# Patient Record
Sex: Female | Born: 1955 | Race: White | Hispanic: No | Smoking: Never smoker
Health system: Southern US, Community
[De-identification: ages and names within clinical notes are randomized; demographics above are authoritative.]

## PROBLEM LIST (undated history)

## (undated) DIAGNOSIS — R002 Palpitations: Secondary | ICD-10-CM

## (undated) DIAGNOSIS — I10 Essential (primary) hypertension: Secondary | ICD-10-CM

## (undated) DIAGNOSIS — R079 Chest pain, unspecified: Secondary | ICD-10-CM

## (undated) DIAGNOSIS — I471 Supraventricular tachycardia, unspecified: Secondary | ICD-10-CM

## (undated) HISTORY — DX: Supraventricular tachycardia, unspecified: I47.10

## (undated) HISTORY — PX: TONSILLECTOMY: SHX5217

## (undated) HISTORY — DX: Chest pain, unspecified: R07.9

## (undated) HISTORY — PX: DILATATION & CURRETTAGE/HYSTEROSCOPY WITH RESECTOCOPE: SHX5572

## (undated) HISTORY — PX: TUBAL LIGATION: SHX77

## (undated) HISTORY — DX: Supraventricular tachycardia: I47.1

## (undated) HISTORY — DX: Palpitations: R00.2

## (undated) HISTORY — PX: APPENDECTOMY: SHX54

## (undated) HISTORY — DX: Essential (primary) hypertension: I10

---

## 1998-07-31 ENCOUNTER — Other Ambulatory Visit: Admission: RE | Admit: 1998-07-31 | Discharge: 1998-07-31 | Payer: Self-pay | Admitting: Obstetrics and Gynecology

## 2001-02-02 ENCOUNTER — Other Ambulatory Visit: Admission: RE | Admit: 2001-02-02 | Discharge: 2001-02-02 | Payer: Self-pay | Admitting: Obstetrics and Gynecology

## 2002-06-28 ENCOUNTER — Other Ambulatory Visit: Admission: RE | Admit: 2002-06-28 | Discharge: 2002-06-28 | Payer: Self-pay | Admitting: Obstetrics and Gynecology

## 2002-07-12 ENCOUNTER — Encounter: Payer: Self-pay | Admitting: Family Medicine

## 2002-07-12 ENCOUNTER — Ambulatory Visit (HOSPITAL_COMMUNITY): Admission: RE | Admit: 2002-07-12 | Discharge: 2002-07-12 | Payer: Self-pay | Admitting: Family Medicine

## 2002-07-19 ENCOUNTER — Ambulatory Visit (HOSPITAL_COMMUNITY): Admission: RE | Admit: 2002-07-19 | Discharge: 2002-07-19 | Payer: Self-pay | Admitting: Obstetrics and Gynecology

## 2002-07-19 ENCOUNTER — Encounter (INDEPENDENT_AMBULATORY_CARE_PROVIDER_SITE_OTHER): Payer: Self-pay

## 2003-06-19 ENCOUNTER — Inpatient Hospital Stay (HOSPITAL_COMMUNITY): Admission: EM | Admit: 2003-06-19 | Discharge: 2003-06-20 | Payer: Self-pay | Admitting: Emergency Medicine

## 2003-06-19 ENCOUNTER — Encounter (INDEPENDENT_AMBULATORY_CARE_PROVIDER_SITE_OTHER): Payer: Self-pay | Admitting: *Deleted

## 2003-08-01 ENCOUNTER — Other Ambulatory Visit: Admission: RE | Admit: 2003-08-01 | Discharge: 2003-08-01 | Payer: Self-pay | Admitting: Obstetrics and Gynecology

## 2005-01-12 ENCOUNTER — Emergency Department (HOSPITAL_COMMUNITY): Admission: EM | Admit: 2005-01-12 | Discharge: 2005-01-12 | Payer: Self-pay | Admitting: Family Medicine

## 2005-10-25 ENCOUNTER — Encounter: Admission: RE | Admit: 2005-10-25 | Discharge: 2005-10-25 | Payer: Self-pay | Admitting: Orthopedic Surgery

## 2007-02-15 ENCOUNTER — Emergency Department (HOSPITAL_COMMUNITY): Admission: EM | Admit: 2007-02-15 | Discharge: 2007-02-16 | Payer: Self-pay | Admitting: Emergency Medicine

## 2007-07-28 LAB — BASIC METABOLIC PANEL
BUN: 17 mg/dL (ref 4–21)
Creatinine: 0.8 mg/dL (ref <=1.1)
GLUCOSE: 83 mg/dL
Potassium: 4.1 mmol/L (ref 3.4–5.3)
SODIUM: 141 mmol/L (ref 137–147)

## 2007-07-28 LAB — HEPATIC FUNCTION PANEL
ALT: 19 U/L (ref 7–35)
AST: 18 U/L (ref 13–35)
Alkaline Phosphatase: 94 U/L (ref 25–125)
Bilirubin, Total: 0.4 mg/dL

## 2007-07-28 LAB — CBC AND DIFFERENTIAL
HEMATOCRIT: 38 % (ref 36–46)
Hemoglobin: 11.9 g/dL — AB (ref 12.0–16.0)
PLATELETS: 300 10*3/uL (ref 150–399)
WBC: 5.9 10*3/mL

## 2007-09-05 LAB — HM COLONOSCOPY

## 2007-12-26 ENCOUNTER — Encounter: Admission: RE | Admit: 2007-12-26 | Discharge: 2007-12-26 | Payer: Self-pay | Admitting: Family Medicine

## 2008-07-23 ENCOUNTER — Encounter (INDEPENDENT_AMBULATORY_CARE_PROVIDER_SITE_OTHER): Payer: Self-pay | Admitting: Obstetrics and Gynecology

## 2008-07-23 ENCOUNTER — Ambulatory Visit (HOSPITAL_COMMUNITY): Admission: RE | Admit: 2008-07-23 | Discharge: 2008-07-23 | Payer: Self-pay | Admitting: Obstetrics and Gynecology

## 2008-10-16 ENCOUNTER — Encounter: Admission: RE | Admit: 2008-10-16 | Discharge: 2008-10-16 | Payer: Self-pay | Admitting: Otolaryngology

## 2010-06-17 LAB — CBC
HCT: 34.5 % — ABNORMAL LOW (ref 36.0–46.0)
Hemoglobin: 12.1 g/dL (ref 12.0–15.0)
MCHC: 35.1 g/dL (ref 30.0–36.0)
MCV: 90 fL (ref 78.0–100.0)
Platelets: 263 10*3/uL (ref 150–400)
RBC: 3.84 MIL/uL — ABNORMAL LOW (ref 3.87–5.11)
RDW: 13.5 % (ref 11.5–15.5)
WBC: 5.7 10*3/uL (ref 4.0–10.5)

## 2010-07-22 NOTE — Op Note (Signed)
NAME:  Alisha Valencia, Alisha Valencia                 ACCOUNT NO.:  [id number]   MEDICAL RECORD NO.:  [id number]          PATIENT TYPE:  AMB   LOCATION:  SDC                           FACILITY:  [facility]   PHYSICIAN:  Maxie Better, M.D.DATE OF BIRTH:  September 05, 1955   DATE OF PROCEDURE:  07/23/2008  DATE OF DISCHARGE:  07/23/2008                               OPERATIVE REPORT   PREOPERATIVE DIAGNOSES:  Postmenopausal bleeding, endometrial mass.   PROCEDURE:  Diagnostic hysteroscopy, hysteroscopic resection of the  endocervical polyp, D and C.   POSTOPERATIVE DIAGNOSES:  Postmenopausal bleeding, endometrial  mass/endometrial polyp.   ANESTHESIA:  General.   SURGEON:  Maxie Better, MD   ASSISTANT:  None.   PROCEDURE:  Under adequate general anesthesia, the patient was placed in  the dorsal lithotomy position.  She was sterilely prepped and draped in  usual fashion.  The bladder was catheterized for scant amount of urine.  The examination under anesthesia revealed an anteverted uterus.  No  adnexal masses could be appreciated.  Bivalve speculum was placed in the  vagina.  Single-tooth tenaculum was placed on the anterior tip of the  cervix.  The cervix was then serially dilated up to #27 [facility] dilator.  A sorbitol-primed resectoscope was introduced into the cervix; however,  difficulty in visualization despite several attempts resulted in the  resectoscope being changed to the diagnostic hysteroscope with  visualization much improved.  The uterine cavity was small.  The tubal  ostia were not seen.  The endocervical canal was elongated, and there  was an endocervical polyp noted anteriorly.  The diagnostic hysteroscope  was removed and attempt at grasping the polyp with the forceps clamp was  unsuccessful, and at that point the resectoscope was then reinserted  carefully entering the endocervical canal.  The polyp was identified and  resected, and the resectoscope removed.  The cavity was  curetted for  scant amount of tissue.   SPECIMEN:  Endocervical polyp and endometrial curettings were sent to  Pathology.   ESTIMATED BLOOD LOSS:  Minimal.   FLUID DEFICIT:  About 100.   COMPLICATIONS:  None.   The patient tolerated the procedure well, was transferred to recovery  room in stable condition.      Maxie Better, M.D.  Electronically Signed     [place]/MEDQ  D:  07/23/2008  T:  07/24/2008  Job:  [id number]

## 2010-07-25 NOTE — Op Note (Signed)
NAME:  Alisha Valencia, Alisha Valencia                           ACCOUNT NO.:  [id number]   MEDICAL RECORD NO.:  [id number]                   PATIENT TYPE:  AMB   LOCATION:  SDC                                  [facility]ACILITY:  [facility]   PHYSICIAN:  Sherry A. Rosalio Macadamia, M.D.           DATE OF BIRTH:  1955-06-09   DATE OF PROCEDURE:  07/19/2002  DATE OF DISCHARGE:                                 OPERATIVE REPORT   PREOPERATIVE DIAGNOSIS:  Menorrhagia.  Endometrial polyp.   POSTOPERATIVE DIAGNOSIS:  Menorrhagia.  Endometrial polyp.   PROCEDURE:  D & C hysteroscopy with resectoscope.   SURGEON:  Sherry A. Rosalio Macadamia, M.D.   ANESTHESIA:  MAC.   INDICATIONS:  This is a 55 year old G2, P2, 0-0-2, woman who has had  increasing menorrhagia with her monthly menses over the past few months.  Because of this, the patient had an ultrasound, which revealed a slightly  enlarged uterus with a thickened endometrium with a mass present in the  endometrial cavity.  Because of that, the patient was brought to the  operating room for D & C hysteroscopy with resectoscope.   FINDINGS:  Nine-week size, anteflexed uterus.  No adnexal mass.  Endometrial  polyp at right cornua.   PROCEDURE:  The patient was brought into the operating room and given  adequate IV sedation.  She was placed in the dorsolithotomy position.  The  perineum was washed with Hibiclens.  Pelvic examination was performed.  The  bladder was in-out catheterized.  The patient was draped in a sterile  fashion.  A speculum was placed within the vagina.  The vagina was washed  with Hibiclens.  Paracervical block was administered with 1% Nesacaine.  The  anterior lip of the cervix was grasped with a single-tooth tenaculum.  The  cervix was sounded.  The cervix was dilated with Pratt dilators to a #31.  The hysteroscope was introduced into the endometrial cavity.  Pictures were  obtained.  The right cornual polyp was removed using a single-loop right  angle  resector.  Some more pieces of the polyp were removed.  Then, sheaths  of endometrial tissue were resected circumferentially for sampling.  Small  bleeders were cauterized.  Pictures were obtained.  All instruments were  removed from the vagina.  Adequate hemostasis was present.  The patient was  taken out of the dorsolithotomy position.  She was awakened.  She was moved  from the operating table to a stretcher in a stable condition.   COMPLICATIONS:  None.   ESTIMATED BLOOD LOSS:  Less than 5 mL.   Sorbitol differential was -90 mL.                                               Sherry A. Rosalio Macadamia, M.D.  SAD/MEDQ  D:  07/19/2002  T:  07/20/2002  Job:  [id number]

## 2010-07-25 NOTE — Op Note (Signed)
NAME:  Alisha Valencia, Alisha Valencia                           ACCOUNT NO.:  [id number]   MEDICAL RECORD NO.:  [id number]                   PATIENT TYPE:  INP   LOCATION:  0445                                 FACILITY:  [facility]   PHYSICIAN:  Anselm Pancoast. Zachery Dakins, M.D.          DATE OF BIRTH:  [birth year]   DATE OF PROCEDURE:  06/19/2003  DATE OF DISCHARGE:                                 OPERATIVE REPORT   PREOPERATIVE DIAGNOSIS:  Acute appendicitis.   POSTOPERATIVE DIAGNOSIS:  Acute appendicitis.   OPERATION:  Laparoscopic appendectomy.   ANESTHESIA:  General.   SURGEON:  Anselm Pancoast. Zachery Dakins, M.D.   ASSISTANT:  Nurse.   HISTORY:  Alisha Valencia is a 55 year old female.  It is actually her birthday  today.  She started having abdominal pain, gaseous cramping, yesterday  afternoon as she was flying back from [place].  Last evening, she had more  pain.  The pain kind of shifted from her lower abdomen, and this morning,  she went to her gynecologist, who did an examination and found her  definitely tender.  Ultrasound was consistent with acute appendicitis.  They  called and I saw her in the emergency room.  Her white count was  significantly elevated, and I was in agreement that this was most likely  acute appendicitis and recommended to proceed with a laparoscopic  appendectomy.  She was given 3 gm of Unasyn on admission for surgery.   Patient was positioned on the OR table, and induction of general anesthesia  and Foley catheter inserted sterilely.  The abdomen was prepped with  Betadine surgical scrub solution and draped in a sterile manner.  She had a  previous tubal ligation.  A vertical incision was made at the umbilicus.  The fascia was identified.  She is a little thicker than she appeared on the  first examination.  This was picked up between [facility] and then carefully  entered through the fascia.  The posterior peritoneum was identified, and  then this was picked up, and a small opening  carefully made.  The purse-  string suture was placed, and the Hasson cannula inserted.  There was  definitely turbid, infected fluid in the right lower quadrant.  I could not  definitely see the appendix, but the upper 5 mm trocar was placed in the  right upper quadrant and with this kind of pushing the cecum and small  bowel, you could see a remarkably inflamed appendix, not retrocecal but kind  of curved into.  The 10-11 point was placed in the left lower quadrant under  direct vision, and then I grasped the appendix, really at the umbilicus with  a __________, and then using the harmonic scalpel, I kind of opened the  peritoneum laterally.  She had a thick appendiceal mesentery, kind of  prevented it from coming up.  With this, I could then go ahead and carefully  transect the appendiceal mesentery  with good hemostasis.  The appendix was  inflamed for about 1 cm from its junction at the cecum.  I completely freed  it on down so that the appendix could be removed using a vascular GIA placed  through the left lower port, and then this under direct vision after being  sure that we had good position and fired the stapler.  There was good  hemostasis and then the inflamed appendix was placed in an EndoCatch bag.  I  had cultured the peritoneal fluid prior to any irrigation, and then we  irrigated everything out.  Brought out the bag containing the appendix at  the umbilicus and reinserted the Hasson port.  Next, the irrigated fluid  returned all clear.  I removed the 11 port in the left lower quadrant.  No  bleeding.  Then the upper 5 mm port.  Then we withdrew the Hasson cannula at  the umbilicus.  I placed additional figure-of-eights in addition to the  purse-string at the umbilicus.  These were tied, and I anesthetized the  fascia there.  The other ports had been anesthetized before insertion with  Marcaine.  I placed a single stitch in the anterior oblique fascia in the  left lower  quadrant.  The subcutaneous wounds were closed with 4-0 Vicryl,  and Benzoin and Steri-Strips on the skin.  Patient tolerated the procedure  nicely.  Will be kept overnight and discharged in the morning.  If the  culture of the peritoneal fluid is growing organisms, will probably  discharge her on a couple of days of Augmentin.                                               Anselm Pancoast. Zachery Dakins, M.D.    [facility]/MEDQ  D:  06/19/2003  T:  06/19/2003  Job:  [id number]   cc:   Sherry A. Rosalio Macadamia, M.D.  [address]  [place]  [place] [address]  Fax: [phone]

## 2010-08-15 ENCOUNTER — Other Ambulatory Visit: Payer: Self-pay | Admitting: Obstetrics and Gynecology

## 2010-12-15 LAB — I-STAT 8, (EC8 V) (CONVERTED LAB)
Acid-Base Excess: 3 — ABNORMAL HIGH
BUN: 16
Bicarbonate: 26.6 — ABNORMAL HIGH
Chloride: 109
Glucose, Bld: 94
HCT: 39
Hemoglobin: 13.3
Operator id: 282201
Potassium: 4.5
Sodium: 140
TCO2: 28
pCO2, Ven: 37.6 — ABNORMAL LOW
pH, Ven: 7.457 — ABNORMAL HIGH

## 2010-12-15 LAB — POCT CARDIAC MARKERS
CKMB, poc: <1 — ABNORMAL LOW
Myoglobin, poc: 52.2
Operator id: 282201
Troponin i, poc: <0.05

## 2010-12-15 LAB — POCT I-STAT CREATININE
Creatinine, Ser: 0.8
Operator id: 282201

## 2012-01-08 ENCOUNTER — Other Ambulatory Visit: Payer: Self-pay | Admitting: Family Medicine

## 2012-01-08 DIAGNOSIS — R1011 Right upper quadrant pain: Secondary | ICD-10-CM

## 2012-01-11 ENCOUNTER — Ambulatory Visit
Admission: RE | Admit: 2012-01-11 | Discharge: 2012-01-11 | Disposition: A | Discharge status: Home / Self Care | Payer: BC Managed Care – PPO | Source: Ambulatory Visit | Attending: Family Medicine | Admitting: Family Medicine

## 2012-01-11 ENCOUNTER — Other Ambulatory Visit: Payer: Self-pay | Admitting: Family Medicine

## 2012-01-11 DIAGNOSIS — R1011 Right upper quadrant pain: Secondary | ICD-10-CM

## 2012-05-25 ENCOUNTER — Encounter: Payer: Self-pay | Admitting: *Deleted

## 2012-06-01 ENCOUNTER — Other Ambulatory Visit (HOSPITAL_COMMUNITY): Payer: Self-pay | Admitting: Cardiovascular Disease

## 2012-06-01 DIAGNOSIS — R942 Abnormal results of pulmonary function studies: Secondary | ICD-10-CM

## 2012-06-10 ENCOUNTER — Ambulatory Visit (HOSPITAL_COMMUNITY)
Admission: RE | Admit: 2012-06-10 | Discharge: 2012-06-10 | Disposition: A | Discharge status: Home / Self Care | Payer: BC Managed Care – PPO | Source: Ambulatory Visit | Attending: Cardiovascular Disease | Admitting: Cardiovascular Disease

## 2012-06-10 DIAGNOSIS — R942 Abnormal results of pulmonary function studies: Secondary | ICD-10-CM

## 2012-06-10 DIAGNOSIS — R079 Chest pain, unspecified: Secondary | ICD-10-CM | POA: Insufficient documentation

## 2012-06-10 DIAGNOSIS — R0602 Shortness of breath: Secondary | ICD-10-CM | POA: Insufficient documentation

## 2012-06-10 DIAGNOSIS — I499 Cardiac arrhythmia, unspecified: Secondary | ICD-10-CM | POA: Insufficient documentation

## 2012-06-10 HISTORY — PX: OTHER SURGICAL HISTORY: SHX169

## 2012-06-10 MED ORDER — TECHNETIUM TC 99M SESTAMIBI GENERIC - CARDIOLITE
30.1000 | Freq: Once | INTRAVENOUS | Status: AC | PRN
  Administered 2012-06-10: 30.1 via INTRAVENOUS

## 2012-06-10 MED ORDER — TECHNETIUM TC 99M SESTAMIBI GENERIC - CARDIOLITE
10.4000 | Freq: Once | INTRAVENOUS | Status: AC | PRN
  Administered 2012-06-10: 10 via INTRAVENOUS

## 2012-06-10 NOTE — Procedures (Addendum)
[facility] [address] [place] [address] [place] [address] [phone]  Cardiology Nuclear Med Study  Alisha Valencia is a 57 y.o. female     MRN : [id number]     DOB: 06-03-1955  Procedure Date: 06/10/2012  Nuclear Med Background Indication for Stress Test:  Evaluation for Ischemia History:  IRREGULAR HEART RATE Cardiac Risk Factors: Family History - CAD  Symptoms:  Chest Pain and SOB   Nuclear Pre-Procedure Caffeine/Decaff Intake:  7:00pm NPO After: 5:00am   IV Site: R Antecubital  IV 0.9% NS with Angio Cath:  22g  Chest Size (in):  N/A IV Started by: Emmit Pomfret, RN  Height: 5\' 6"  (1.676 m)  Cup Size: DD  BMI:  Body mass index is 22.12 kg/(m^2). Weight:  137 lb (62.143 kg)   Tech Comments:  N/A    Nuclear Med Study 1 or 2 day study: 1 day  Stress Test Type:  Stress  Order Authorizing Provider:  Thurmon Fair, MD   Resting Radionuclide: Technetium 102m Sestamibi  Resting Radionuclide Dose: 10.4 mCi   Stress Radionuclide:  Technetium 51m Sestamibi  Stress Radionuclide Dose: 30.8 mCi           Stress Protocol Rest HR: 74 Stress HR: 171  Rest BP: 141/78 Stress BP: 152/76  Exercise Time (min): 7:30 METS: 9.3   Predicted Max HR: 165 bpm % Max HR: 103.64 bpm Rate Pressure Product: 21308  Dose of Adenosine (mg):  n/a Dose of Lexiscan: n/a mg  Dose of Atropine (mg): n/a Dose of Dobutamine: n/a mcg/kg/min (at max HR)  Stress Test Technologist: Esperanza Sheets, CCT Nuclear Technologist: Alisha Valencia, CNMT   Rest Procedure:  Myocardial perfusion imaging was performed at rest 45 minutes following the intravenous administration of Technetium 16m Sestamibi. Stress Procedure:  The patient performed treadmill exercise using a Bruce  Protocol for 7:30 minutes. The patient stopped due to achieving excess of target heart rate. and denied any chest pain.  There were no significant ST-T wave changes.  Technetium 64m Sestamibi was injected  at peak exercise and myocardial perfusion imaging was performed after a brief delay.  Transient Ischemic Dilatation (Normal <1.22):  0.73 Lung/Heart Ratio (Normal <0.45):  0.33 QGS EDV:  50 ml QGS ESV:  14 ml LV Ejection Fraction: 72% -- overestimation due to low LV Volumes.  Signed by Susa Loffler A on 06/10/2012 at 10:34 AM.  PHYSICIAN INTERPRETATION  Rest ECG: NSR with non-specific ST-T wave changes  Stress ECG: Insignificant upsloping ST segment depression. - Inferior leads.  QPS Raw Data Images:  Mild breast attenuation.  Normal left ventricular size, but small measured volume Stress Images:  There is mild apical thinning with normal uptake in other regions. Rest Images:  Comparison with the stress images reveals no significant change. Subtraction (SDS):  There is a fixed anterior defect that is most consistent with breast attenuation.  There is no evidence of scar or ischemia.  Impression Exercise Capacity:  Good exercise capacity. BP Response:  Normal blood pressure response. Clinical Symptoms:  Left sided Neck pain 2-3/4 ECG Impression:  Insignificant upsloping ST segment depression.  LV Wall Motion:  NL LV Function; NL Wall Motion - LVEF likely overestimated due to low LV volumes.  Comparison with Prior Nuclear Study: No significant change from previous study  Overall Impression:  Normal stress nuclear study.  Low risk stress nuclear study.   Alisha Lex, MD  06/12/2012 10:33 PM

## 2012-07-15 ENCOUNTER — Other Ambulatory Visit (HOSPITAL_COMMUNITY): Payer: Self-pay | Admitting: Cardiovascular Disease

## 2012-07-15 DIAGNOSIS — R079 Chest pain, unspecified: Secondary | ICD-10-CM

## 2012-07-17 ENCOUNTER — Encounter: Payer: Self-pay | Admitting: *Deleted

## 2012-07-21 ENCOUNTER — Ambulatory Visit (HOSPITAL_COMMUNITY)
Admission: RE | Admit: 2012-07-21 | Discharge: 2012-07-21 | Disposition: A | Discharge status: Home / Self Care | Payer: BC Managed Care – PPO | Source: Ambulatory Visit | Attending: Cardiovascular Disease | Admitting: Cardiovascular Disease

## 2012-07-21 DIAGNOSIS — I059 Rheumatic mitral valve disease, unspecified: Secondary | ICD-10-CM | POA: Insufficient documentation

## 2012-07-21 DIAGNOSIS — I379 Nonrheumatic pulmonary valve disorder, unspecified: Secondary | ICD-10-CM | POA: Insufficient documentation

## 2012-07-21 DIAGNOSIS — R9430 Abnormal result of cardiovascular function study, unspecified: Secondary | ICD-10-CM

## 2012-07-21 DIAGNOSIS — R079 Chest pain, unspecified: Secondary | ICD-10-CM | POA: Insufficient documentation

## 2012-07-21 NOTE — Progress Notes (Signed)
[facility]   2D echo completed 07/21/2012.   Cindy Thaddeaus Monica, RDCS  

## 2012-08-15 ENCOUNTER — Encounter: Payer: Self-pay | Admitting: Cardiovascular Disease

## 2012-11-16 LAB — FECAL OCCULT BLOOD, GUAIAC: FECAL OCCULT BLD: NEGATIVE

## 2012-11-16 LAB — CBC AND DIFFERENTIAL
HEMATOCRIT: 36 % (ref 36–46)
HEMOGLOBIN: 12 g/dL (ref 12.0–16.0)
PLATELETS: 263 10*3/uL (ref 150–399)
WBC: 5.3 10*3/mL

## 2012-11-16 LAB — BASIC METABOLIC PANEL
BUN: 16 mg/dL (ref 4–21)
Creatinine: 0.7 mg/dL (ref 0.5–1.1)
Glucose: 85 mg/dL
POTASSIUM: 4.9 mmol/L (ref 3.4–5.3)
Sodium: 139 mmol/L (ref 137–147)

## 2012-11-16 LAB — LIPID PANEL
CHOLESTEROL: 191 mg/dL (ref 0–200)
HDL: 59 mg/dL (ref 35–70)
LDL Cholesterol: 118 mg/dL
LDl/HDL Ratio: 3.2
TRIGLYCERIDES: 68 mg/dL (ref 40–160)

## 2012-11-16 LAB — HEPATIC FUNCTION PANEL
ALT: 23 U/L (ref 7–35)
AST: 22 U/L (ref 13–35)

## 2012-11-16 LAB — TSH: TSH: 0.78 u[IU]/mL (ref 0.41–5.90)

## 2013-08-22 ENCOUNTER — Other Ambulatory Visit: Payer: Self-pay | Admitting: Dermatology

## 2014-10-25 ENCOUNTER — Other Ambulatory Visit: Payer: Self-pay | Admitting: Family Medicine

## 2014-10-25 ENCOUNTER — Ambulatory Visit
Admission: RE | Admit: 2014-10-25 | Discharge: 2014-10-25 | Disposition: A | Discharge status: Home / Self Care | Payer: 59 | Source: Ambulatory Visit | Attending: Family Medicine | Admitting: Family Medicine

## 2014-10-25 DIAGNOSIS — R0602 Shortness of breath: Secondary | ICD-10-CM

## 2015-01-28 LAB — HM PAP SMEAR: HM Pap smear: NEGATIVE

## 2015-01-28 LAB — HM MAMMOGRAPHY

## 2015-06-25 DIAGNOSIS — R609 Edema, unspecified: Secondary | ICD-10-CM | POA: Diagnosis not present

## 2015-06-25 DIAGNOSIS — L259 Unspecified contact dermatitis, unspecified cause: Secondary | ICD-10-CM | POA: Diagnosis not present

## 2015-08-01 ENCOUNTER — Encounter: Payer: Self-pay | Admitting: Family Medicine

## 2015-08-01 ENCOUNTER — Ambulatory Visit (INDEPENDENT_AMBULATORY_CARE_PROVIDER_SITE_OTHER): Payer: BLUE CROSS/BLUE SHIELD | Admitting: Family Medicine

## 2015-08-01 VITALS — BP 124/72 | HR 89 | Ht 65.5 in | Wt 136.0 lb

## 2015-08-01 DIAGNOSIS — K219 Gastro-esophageal reflux disease without esophagitis: Secondary | ICD-10-CM | POA: Diagnosis not present

## 2015-08-01 DIAGNOSIS — R059 Cough, unspecified: Secondary | ICD-10-CM

## 2015-08-01 DIAGNOSIS — R05 Cough: Secondary | ICD-10-CM | POA: Diagnosis not present

## 2015-08-01 MED ORDER — OMEPRAZOLE 40 MG PO CPDR: DELAYED_RELEASE_CAPSULE | ORAL | Status: DC

## 2015-08-01 NOTE — Assessment & Plan Note (Signed)
-   Chronic dry cough since June 2016 just in the a.m. denies metallic taste in a.m. - We'll do a trial of meds for reflux as I have a high suspicion for that. Once I told the patient I was thinking about doing that she does state she drinks a lot of coffee with chocolate as well. She states that eating pizza at night will often cause her to have a sharp pain into her back in the middle of the night.    - I have asked her to keep a food journal and symptom journal of when she gets these and see if it's related to anything that she is eating or doing.  - We will wait medical records from her PCP and look at what her chest x-ray showed in June 2016. Patient is okay with not obtaining imaging today since she sounds completely clear and exam is normal. Upon follow-up in one month if patient is not free of symptoms we will consider CT of chest at that time.

## 2015-08-01 NOTE — Patient Instructions (Signed)
-   keep food and symptom journal - use meds daily and if any [problems call clinic to discuss.  - f/up one month - pt will get me med records from PCP.   Food Choices for Gastroesophageal Reflux Disease, Adult When you have gastroesophageal reflux disease (GERD), the foods you eat and your eating habits are very important. Choosing the right foods can help ease the discomfort of GERD. WHAT GENERAL GUIDELINES DO I NEED TO FOLLOW?  Choose fruits, vegetables, whole grains, low-fat dairy products, and low-fat meat, fish, and poultry.  Limit fats such as oils, salad dressings, butter, nuts, and avocado.  Keep a food diary to identify foods that cause symptoms.  Avoid foods that cause reflux. These may be different for different people.  Eat frequent small meals instead of three large meals each day.  Eat your meals slowly, in a relaxed setting.  Limit fried foods.  Cook foods using methods other than frying.  Avoid drinking alcohol.  Avoid drinking large amounts of liquids with your meals.  Avoid bending over or lying down until 2-3 hours after eating. WHAT FOODS ARE NOT RECOMMENDED? The following are some foods and drinks that may worsen your symptoms: Vegetables Tomatoes. Tomato juice. Tomato and spaghetti sauce. Chili peppers. Onion and garlic. Horseradish. Fruits Oranges, grapefruit, and lemon (fruit and juice). Meats High-fat meats, fish, and poultry. This includes hot dogs, ribs, ham, sausage, salami, and bacon. Dairy Whole milk and chocolate milk. Sour cream. Cream. Butter. Ice cream. Cream cheese.  Beverages Coffee and tea, with or without caffeine. Carbonated beverages or energy drinks. Condiments Hot sauce. Barbecue sauce.  Sweets/Desserts Chocolate and cocoa. Donuts. Peppermint and spearmint. Fats and Oils High-fat foods, including [place] fries and potato chips. Other Vinegar. Strong spices, such as black pepper, white pepper, red pepper, cayenne, curry powder,  cloves, ginger, and chili powder. The items listed above may not be a complete list of foods and beverages to avoid. Contact your dietitian for more information.   This information is not intended to replace advice given to you by your health care provider. Make sure you discuss any questions you have with your health care provider.   Document Released: 02/23/2005 Document Revised: 03/16/2014 Document Reviewed: 12/28/2012 Elsevier Interactive Patient Education [employer].

## 2015-08-01 NOTE — Progress Notes (Signed)
Marjory Sneddon, D.O. Family Medicine Physician [facility] Location: Primary Care at [facility]     Subjective:    CC: New pt, here to establish care.   HPI: Alisha Valencia is a pleasant 60 y.o. female who presents to [facility] today for For evaluation of her chronic symptoms and transfer of care. Patient sees Dr. cousin GYN in [place] as well as Dr. Donnie Coffin is her PCP from [facility] in [place]. Patient states that in June 2016 she had "pneumonia" but no fever chills and otherwise didn't feel bad.  She states that she's never felt normal since then. She complains of a chronic nonproductive cough mostly in the a.m.'s and feels a "gurgling" sensation like bubbles and water her in her left anterior rib region. Patient is worried that there is something in her lungs which possibly had not resolved or has gotten worse since she was told she had pneumonia in June.  Patient denies any productive cough. Dry cough just in the a.m.'s. No rib pain, no shortness of breath, no wheeze, no dyspnea on exertion, no paroxysmal nocturnal dyspnea.  She denies chest pain, or heart palpitations. She denies any nausea vomiting diarrhea or abdominal pain.  She does note hayfever and seasonal allergies on her adult medical history review of systems form. She states this is not bothersome enough to take any over-the-counter medications for it.  Past Medical History  Diagnosis Date  . SVT (supraventricular tachycardia) ([place])   . Hypertension   . Chest pain   . Palpitations     Past Surgical History  Procedure Laterality Date  . Tubal ligation    . Tonsillectomy    . Dilatation & currettage/hysteroscopy with resectocope      x2  . Nuclear stress test  06/10/2012    Normal  . Appendectomy      @HXFAM @  History  Drug Use No  ,  History  Alcohol Use No  ,  History  Smoking status  . Never Smoker   Smokeless tobacco  . Never Used  ,  History   Sexual Activity  . Sexual Activity: Not on file    Patient's Medications  New Prescriptions   OMEPRAZOLE (PRILOSEC) 40 MG CAPSULE    Take one tab twice daily 2 wks, then one tab daily  Previous Medications   No medications on file  Modified Medications   No medications on file  Discontinued Medications   ASCORBIC ACID (VITAMIN C) 1000 MG TABLET    Take 1,000 mg by mouth daily.   ASPIRIN 81 MG TABLET    Take 81 mg by mouth as needed for pain.   DILTIAZEM (CARDIZEM CD) 180 MG 24 HR CAPSULE    Take 180 mg by mouth daily.    Erythromycin   Review of Systems: Full 14 point ROS performed via "adult medical history form".  Negative except for Hayfever\allergies and cough   Objective:   Blood pressure 124/72, pulse 89, height 5' 5.5" (1.664 m), weight 136 lb (61.689 kg), SpO2 100 %. Body mass index is 22.28 kg/(m^2).   General: Well Developed, well nourished, and in no acute distress.  Neuro: Alert and oriented x3, extra-ocular muscles intact, sensation grossly intact.  HEENT: Normocephalic, atraumatic, pupils equal round reactive to light, neck supple, no gross masses, no carotid bruits, no JVD apprec Skin: no gross suspicious lesions or rashes  Cardiac: Regular rate and rhythm, no murmurs rubs or gallops.  Respiratory:  Essentially clear to auscultation anteriorly and posteriorly as well as bilaterally.   Lungs and chest normal- no palpable crepitus or subcutaneous emphysema.  No rib tenderness to palpation Not using accessory muscles, speaking in full sentences.  Abdominal: Soft, not grossly distended, no guarding rigidity rebound, bowel sounds 4 quadrants,  Skin: no gross skin abnormalities (lipomas/rashes etc) anterior abdomen or chest. Musculoskeletal: Ambulates w/o diff, FROM * 4 ext.  Vasc: less 2 sec cap RF, warm and pink  Psych:  No HI/SI, judgement and insight good.    Impression and Recommendations:    The patient was counselled, risk factors were discussed,  anticipatory guidance given.  1) chronic dry cough- unknown etiology - Counseling performed and discussed the various etiologies that can cause dry cough including but not limited to allergies, postnasal drip, reflux etc  2) esophageal reflux-  - Chronic dry cough since June 2016 just in the a.m. denies metallic taste in a.m. - We'll do a trial of meds for reflux as I have a high suspicion for that. Once I told the patient I was thinking about doing that she does state she drinks a lot of coffee with chocolate as well. She states that eating pizza at night will often cause her to have a sharp pain into her back in the middle of the night.    - I have asked her to keep a food journal and symptom journal of when she gets these and see if it's related to anything that she is eating or doing.  - We will wait medical records from her PCP and look at what her chest x-ray showed in June 2016. Patient is okay with not obtaining imaging today since she sounds completely clear and exam is normal. Upon follow-up in one month if patient is not free of symptoms we will consider CT of chest at that time.

## 2015-08-01 NOTE — Assessment & Plan Note (Addendum)
Counseling performed and discussed the various etiologies that can cause dry cough including but not limited to allergies, postnasal drip, reflux etc

## 2015-08-20 DIAGNOSIS — H524 Presbyopia: Secondary | ICD-10-CM | POA: Diagnosis not present

## 2015-08-20 DIAGNOSIS — H43813 Vitreous degeneration, bilateral: Secondary | ICD-10-CM | POA: Diagnosis not present

## 2015-08-20 DIAGNOSIS — H5203 Hypermetropia, bilateral: Secondary | ICD-10-CM | POA: Diagnosis not present

## 2015-08-21 DIAGNOSIS — Z808 Family history of malignant neoplasm of other organs or systems: Secondary | ICD-10-CM | POA: Diagnosis not present

## 2015-08-21 DIAGNOSIS — L708 Other acne: Secondary | ICD-10-CM | POA: Diagnosis not present

## 2015-08-21 DIAGNOSIS — D225 Melanocytic nevi of trunk: Secondary | ICD-10-CM | POA: Diagnosis not present

## 2015-09-03 ENCOUNTER — Ambulatory Visit (HOSPITAL_COMMUNITY)
Admission: RE | Admit: 2015-09-03 | Discharge: 2015-09-03 | Disposition: A | Discharge status: Home / Self Care | Payer: BLUE CROSS/BLUE SHIELD | Source: Ambulatory Visit | Attending: Family Medicine | Admitting: Family Medicine

## 2015-09-03 ENCOUNTER — Ambulatory Visit (INDEPENDENT_AMBULATORY_CARE_PROVIDER_SITE_OTHER): Payer: BLUE CROSS/BLUE SHIELD | Admitting: Family Medicine

## 2015-09-03 ENCOUNTER — Encounter: Payer: Self-pay | Admitting: Family Medicine

## 2015-09-03 VITALS — BP 133/78 | HR 71 | Ht 65.5 in | Wt 139.2 lb

## 2015-09-03 DIAGNOSIS — R911 Solitary pulmonary nodule: Secondary | ICD-10-CM

## 2015-09-03 DIAGNOSIS — K219 Gastro-esophageal reflux disease without esophagitis: Secondary | ICD-10-CM | POA: Diagnosis not present

## 2015-09-03 DIAGNOSIS — R05 Cough: Secondary | ICD-10-CM | POA: Insufficient documentation

## 2015-09-03 DIAGNOSIS — R059 Cough, unspecified: Secondary | ICD-10-CM

## 2015-09-03 DIAGNOSIS — Z833 Family history of diabetes mellitus: Secondary | ICD-10-CM

## 2015-09-03 NOTE — Assessment & Plan Note (Signed)
Obtain chest x-ray.  Importance of this stressed to patient.    order was sent to [facility] of patient's choice.

## 2015-09-03 NOTE — Assessment & Plan Note (Signed)
Patient wishes to continue with omeprazole once daily.  She states that taking 40 per day caused some hair loss but one tablet a day, there is no side effects and she feels it is helping her.  Advised to keep a food journal and symptom journal to see if she can identify triggers.

## 2015-09-03 NOTE — Progress Notes (Signed)
Subjective:    CC: f/up GERD/ dry cough  HPI: Alisha Valencia is a 60 y.o. female who presents to [place] at [facility] today  for f/up of her chronic dry cough.      Did not keep a food journal but notes that heartburn occurs with burgers and larger meals.  Since going on the meds- omeprazole- some sx have resolved ( gurgling in her belly) and now she does feel heartburn which she didn't experience before.    COugh improved some per pt but occ still has dry cough esp in the am, but now she attributes these sx to nasal/ sinus congestion due to seasonal allergies in past couple weeks.    She had a crap load of   Not having BM's unless she gets her coffee in so can't cut that off and likes chocolate with her coffee- so can't cut that off.     Of note:   At the end of the OV with me, pt mentions her old cxr to me... She never did f/up for repeat CXR.  See below.  CLINICAL DATA: Shortness of breath for 3 months. Left-sided chest pain.  EXAM: CHEST 2 VIEW  COMPARISON: 12/26/2007  FINDINGS: There is a subtle 8 x 13 mm nodular density in the lateral aspect of the left mid lung zone. This is not well-defined. The lungs are otherwise clear. Heart size and vascularity are normal. No osseous abnormality. No effusions.  IMPRESSION: Vague nodular appearing density in the lateral aspect of the left mid lung zone. Does the patient have any skin lesions on the left breast or posterior laterally on the back that could account for this poorly defined lesion? If not, I recommend a repeat PA chest and if the density persists, CT scan may be useful.   Electronically Signed  By: Lorriane Shire M.D.  On: 10/25/2014 15:07   Past Medical History  Diagnosis Date  . SVT (supraventricular tachycardia) ([place])   . Hypertension   . Chest pain   . Palpitations     Past Surgical History  Procedure Laterality Date  . Tubal ligation    . Tonsillectomy    .  Dilatation & currettage/hysteroscopy with resectocope      x2  . Nuclear stress test  06/10/2012    Normal  . Appendectomy      Family History  Problem Relation Age of Onset  . Heart failure Father   . Diabetes Sister   . Heart attack Father     History  Drug Use No  ,  History  Alcohol Use No  ,  History  Smoking status  . Never Smoker   Smokeless tobacco  . Never Used  ,  History  Sexual Activity  . Sexual Activity: Not on file    Current Outpatient Prescriptions on File Prior to Visit  Medication Sig Dispense Refill  . omeprazole (PRILOSEC) 40 MG capsule Take one tab twice daily 2 wks, then one tab daily 60 capsule 1   No current facility-administered medications on file prior to visit.    Allergies  Allergen Reactions  . Erythromycin      Review of Systems:  ( Completed via her adult medical history intake form today ) General:  Denies fever, chills, appetite changes, unexplained weight loss.  Respiratory: Denies SOB, DOE, cough, wheezing.  Cardiovascular: Denies chest pain, palpitations.  Gastrointestinal: Denies nausea, vomiting, diarrhea, abdominal pain.  Genitourinary: Denies dysuria, increased frequency, flank  pain. Endocrine: Denies hot or cold intolerance, polyuria, polydipsia. Musculoskeletal: Denies myalgias, back pain, joint swelling, arthralgias, gait problems.  Skin: Denies pallor, rash, suspicious lesions.  Neurological: Denies dizziness, seizures, syncope, unexplained weakness, lightheadedness, numbness and headaches.  Psychiatric/Behavioral: Denies mood changes, suicidal or homicidal ideations, hallucinations, sleep disturbances.   Objective:     Filed Vitals:   09/03/15 0845  Height: 5' 5.5" (1.664 m)  Weight: 139 lb 3.2 oz (63.141 kg)   Blood pressure 133/78, pulse 71, height 5' 5.5" (1.664 m), weight 139 lb 3.2 oz (63.141 kg). Body mass index is 22.8 kg/(m^2). General: Well Developed, well nourished, and in no acute distress.    HEENT: Normocephalic, atraumatic, pupils equal round reactive to light, neck supple, No carotid bruits no JVD Skin: Warm and dry, cap RF less 2 sec Cardiac: Regular rate and rhythm, S1, S2 WNL's, no murmurs rubs or gallops Respiratory: ECTA B/L, Not using accessory muscles, speaking in full sentences. NeuroM-Sk: Ambulates w/o assistance, moves ext * 4 w/o difficulty, sensation grossly intact.  Psych: A and O *3, judgement and insight good.   No results found for this or any previous visit (from the past 2160 hour(s)).  Impression and Recommendations:   Esophageal reflux Patient wishes to continue with omeprazole once daily.  She states that taking 40 per day caused some hair loss but one tablet a day, there is no side effects and she feels it is helping her.  Advised to keep a food journal and symptom journal to see if she can identify triggers.  Cough Asked patient to use sinus rinses twice daily to help with any sinus congestion due to allergies, which may be contributing to postnasal drip and this cough in the evenings and mornings.   Solitary pulmonary nodule Obtain chest x-ray.  Importance of this stressed to patient.    order was sent to [facility] of patient's choice.    Family history of diabetes mellitus in first degree relative We'll obtain fasting labs in the near future.  I asked patient to make an appointment for this before she left the clinic today.  Then, I asked patient to return to clinic to discuss those lab results when they come back   Continue once daily omeprazole  Obtain chest x-ray from [facility].  Then will follow-up as needed after that.   Patient will continue to monitor her diet and symptoms and keep a journal if needed.  Please schedule fasting routine labs in the near future and follow-up for yearly physical after that.  Gross side effects, risk and benefits, and alternatives of medications discussed with patient.  Patient is aware that all  medications have potential side effects and we are unable to predict every sideeffect or drug-drug interaction that may occur.  Expresses verbal understanding and consents to current therapy plan and treatment regiment.  Note: This document was prepared using Dragon voice recognition software and may include unintentional dictation errors.

## 2015-09-03 NOTE — Assessment & Plan Note (Signed)
Asked patient to use sinus rinses twice daily to help with any sinus congestion due to allergies, which may be contributing to postnasal drip and this cough in the evenings and mornings.

## 2015-09-03 NOTE — Patient Instructions (Addendum)
Continue once daily omeprazole  Obtain chest x-ray from [facility].  Then will follow-up as needed after that.   Patient will continue to monitor her diet and symptoms and keep a journal if needed.  Please schedule fasting routine labs in the near future and follow-up for yearly physical after that.   Food Choices for Gastroesophageal Reflux Disease, Adult When you have gastroesophageal reflux disease (GERD), the foods you eat and your eating habits are very important. Choosing the right foods can help ease the discomfort of GERD. WHAT GENERAL GUIDELINES DO I NEED TO FOLLOW?  Choose fruits, vegetables, whole grains, low-fat dairy products, and low-fat meat, fish, and poultry.  Limit fats such as oils, salad dressings, butter, nuts, and avocado.  Keep a food diary to identify foods that cause symptoms.  Avoid foods that cause reflux. These may be different for different people.  Eat frequent small meals instead of three large meals each day.  Eat your meals slowly, in a relaxed setting.  Limit fried foods.  Cook foods using methods other than frying.  Avoid drinking alcohol.  Avoid drinking large amounts of liquids with your meals.  Avoid bending over or lying down until 2-3 hours after eating. WHAT FOODS ARE NOT RECOMMENDED? The following are some foods and drinks that may worsen your symptoms: Vegetables Tomatoes. Tomato juice. Tomato and spaghetti sauce. Chili peppers. Onion and garlic. Horseradish. Fruits Oranges, grapefruit, and lemon (fruit and juice). Meats High-fat meats, fish, and poultry. This includes hot dogs, ribs, ham, sausage, salami, and bacon. Dairy Whole milk and chocolate milk. Sour cream. Cream. Butter. Ice cream. Cream cheese.  Beverages Coffee and tea, with or without caffeine. Carbonated beverages or energy drinks. Condiments Hot sauce. Barbecue sauce.  Sweets/Desserts Chocolate and cocoa. Donuts. Peppermint and spearmint. Fats and  Oils High-fat foods, including [place] fries and potato chips. Other Vinegar. Strong spices, such as black pepper, white pepper, red pepper, cayenne, curry powder, cloves, ginger, and chili powder. The items listed above may not be a complete list of foods and beverages to avoid. Contact your dietitian for more information.   This information is not intended to replace advice given to you by your health care provider. Make sure you discuss any questions you have with your health care provider.   Document Released: 02/23/2005 Document Revised: 03/16/2014 Document Reviewed: 12/28/2012 Elsevier Interactive Patient Education [employer].

## 2015-09-03 NOTE — Assessment & Plan Note (Addendum)
We'll obtain fasting labs in the near future.  I asked patient to make an appointment for this before she left the clinic today.  Then, I asked patient to return to clinic to discuss those lab results when they come back

## 2015-09-06 ENCOUNTER — Other Ambulatory Visit: Payer: Self-pay

## 2015-09-06 ENCOUNTER — Other Ambulatory Visit: Payer: BLUE CROSS/BLUE SHIELD

## 2015-09-06 DIAGNOSIS — Z0000 Encounter for general adult medical examination without abnormal findings: Secondary | ICD-10-CM

## 2015-09-06 DIAGNOSIS — Z833 Family history of diabetes mellitus: Secondary | ICD-10-CM

## 2015-09-09 ENCOUNTER — Other Ambulatory Visit (INDEPENDENT_AMBULATORY_CARE_PROVIDER_SITE_OTHER): Payer: BLUE CROSS/BLUE SHIELD

## 2015-09-09 DIAGNOSIS — Z0000 Encounter for general adult medical examination without abnormal findings: Secondary | ICD-10-CM

## 2015-09-09 DIAGNOSIS — Z833 Family history of diabetes mellitus: Secondary | ICD-10-CM

## 2015-09-10 LAB — VITAMIN D 25 HYDROXY (VIT D DEFICIENCY, FRACTURES): Vit D, 25-Hydroxy: 32 ng/mL (ref 30–100)

## 2015-09-10 LAB — COMPREHENSIVE METABOLIC PANEL
ALBUMIN: 4 g/dL (ref 3.6–5.1)
ALT: 18 U/L (ref 6–29)
AST: 20 U/L (ref 10–35)
Alkaline Phosphatase: 75 U/L (ref 33–130)
BILIRUBIN TOTAL: 0.5 mg/dL (ref 0.2–1.2)
BUN: 20 mg/dL (ref 7–25)
CO2: 29 mmol/L (ref 20–31)
CREATININE: 0.77 mg/dL (ref 0.50–1.05)
Calcium: 9.2 mg/dL (ref 8.6–10.4)
Chloride: 107 mmol/L (ref 98–110)
Glucose, Bld: 89 mg/dL (ref 65–99)
Potassium: 4.8 mmol/L (ref 3.5–5.3)
SODIUM: 143 mmol/L (ref 135–146)
TOTAL PROTEIN: 6.6 g/dL (ref 6.1–8.1)

## 2015-09-10 LAB — HEMOGLOBIN A1C
HEMOGLOBIN A1C: 5.6 % (ref <5.7)
Mean Plasma Glucose: 114 mg/dL

## 2015-09-10 LAB — LIPID PANEL
CHOL/HDL RATIO: 3.3 ratio (ref <=5.0)
Cholesterol: 199 mg/dL (ref 125–200)
HDL: 61 mg/dL (ref >=46)
LDL CALC: 123 mg/dL (ref <130)
Triglycerides: 74 mg/dL (ref <150)
VLDL: 15 mg/dL (ref <30)

## 2015-09-10 LAB — CBC WITH DIFFERENTIAL/PLATELET
Basophils Absolute: 0 cells/uL (ref 0–200)
Basophils Relative: 0 %
EOS PCT: 2 %
Eosinophils Absolute: 96 cells/uL (ref 15–500)
HCT: 34.9 % — ABNORMAL LOW (ref 35.0–45.0)
Hemoglobin: 11.5 g/dL — ABNORMAL LOW (ref 11.7–15.5)
LYMPHS PCT: 41 %
Lymphs Abs: 1968 cells/uL (ref 850–3900)
MCH: 29.5 pg (ref 27.0–33.0)
MCHC: 33 g/dL (ref 32.0–36.0)
MCV: 89.5 fL (ref 80.0–100.0)
MPV: 8.9 fL (ref 7.5–12.5)
Monocytes Absolute: 432 cells/uL (ref 200–950)
Monocytes Relative: 9 %
NEUTROS PCT: 48 %
Neutro Abs: 2304 cells/uL (ref 1500–7800)
Platelets: 241 10*3/uL (ref 140–400)
RBC: 3.9 MIL/uL (ref 3.80–5.10)
RDW: 13 % (ref 11.0–15.0)
WBC: 4.8 10*3/uL (ref 3.8–10.8)

## 2015-09-10 LAB — TSH: TSH: 1.99 m[IU]/L

## 2015-09-20 ENCOUNTER — Encounter: Payer: Self-pay | Admitting: Family Medicine

## 2015-09-20 ENCOUNTER — Ambulatory Visit (INDEPENDENT_AMBULATORY_CARE_PROVIDER_SITE_OTHER): Payer: BLUE CROSS/BLUE SHIELD | Admitting: Family Medicine

## 2015-09-20 VITALS — BP 114/72 | HR 71 | Ht 65.5 in | Wt 137.5 lb

## 2015-09-20 DIAGNOSIS — R911 Solitary pulmonary nodule: Secondary | ICD-10-CM | POA: Diagnosis not present

## 2015-09-20 DIAGNOSIS — M19042 Primary osteoarthritis, left hand: Secondary | ICD-10-CM

## 2015-09-20 DIAGNOSIS — M19041 Primary osteoarthritis, right hand: Secondary | ICD-10-CM

## 2015-09-20 DIAGNOSIS — M1990 Unspecified osteoarthritis, unspecified site: Secondary | ICD-10-CM | POA: Diagnosis not present

## 2015-09-20 DIAGNOSIS — Z01419 Encounter for gynecological examination (general) (routine) without abnormal findings: Secondary | ICD-10-CM

## 2015-09-20 DIAGNOSIS — C4492 Squamous cell carcinoma of skin, unspecified: Secondary | ICD-10-CM | POA: Diagnosis not present

## 2015-09-20 DIAGNOSIS — Z114 Encounter for screening for human immunodeficiency virus [HIV]: Secondary | ICD-10-CM

## 2015-09-20 DIAGNOSIS — Z1159 Encounter for screening for other viral diseases: Secondary | ICD-10-CM | POA: Diagnosis not present

## 2015-09-20 DIAGNOSIS — Z833 Family history of diabetes mellitus: Secondary | ICD-10-CM

## 2015-09-20 DIAGNOSIS — K219 Gastro-esophageal reflux disease without esophagitis: Secondary | ICD-10-CM

## 2015-09-20 DIAGNOSIS — I10 Essential (primary) hypertension: Secondary | ICD-10-CM | POA: Diagnosis not present

## 2015-09-20 MED ORDER — VITAMIN D3 125 MCG (5000 UT) PO TABS: ORAL_TABLET | ORAL | Status: DC

## 2015-09-20 NOTE — Patient Instructions (Addendum)
penetrex arthritis cream      Arthritis Arthritis is a term that is commonly used to refer to joint pain or joint disease. There are more than 100 types of arthritis. CAUSES The most common cause of this condition is wear and tear of a joint. Other causes include:  Gout.  Inflammation of a joint.  An infection of a joint.  Sprains and other injuries near the joint.  A drug reaction or allergic reaction. In some cases, the cause may not be known. SYMPTOMS The main symptom of this condition is pain in the joint with movement. Other symptoms include:  Redness, swelling, or stiffness at a joint.  Warmth coming from the joint.  Fever.  Overall feeling of illness. DIAGNOSIS This condition may be diagnosed with a physical exam and tests, including:  Blood tests.  Urine tests.  Imaging tests, such as MRI, X-rays, or a CT scan. Sometimes, fluid is removed from a joint for testing. TREATMENT Treatment for this condition may involve:  Treatment of the cause, if it is known.  Rest.  Raising (elevating) the joint.  Applying cold or hot packs to the joint.  Medicines to improve symptoms and reduce inflammation.  Injections of a steroid such as cortisone into the joint to help reduce pain and inflammation. Depending on the cause of your arthritis, you may need to make lifestyle changes to reduce stress on your joint. These changes may include exercising more and losing weight. HOME CARE INSTRUCTIONS Medicines  Take over-the-counter and prescription medicines only as told by your health care provider.  Do not take aspirin to relieve pain if gout is suspected. Activities  Rest your joint if told by your health care provider. Rest is important when your disease is active and your joint feels painful, swollen, or stiff.  Avoid activities that make the pain worse. It is important to balance activity with rest.  Exercise your joint regularly with range-of-motion  exercises as told by your health care provider. Try doing low-impact exercise, such as:  Swimming.  Water aerobics.  Biking.  Walking. Joint Care  If your joint is swollen, keep it elevated if told by your health care provider.  If your joint feels stiff in the morning, try taking a warm shower.  If directed, apply heat to the joint. If you have diabetes, do not apply heat without permission from your health care provider.  Put a towel between the joint and the hot pack or heating pad.  Leave the heat on the area for 20-30 minutes.  If directed, apply ice to the joint:  Put ice in a plastic bag.  Place a towel between your skin and the bag.  Leave the ice on for 20 minutes, 2-3 times per day.  Keep all follow-up visits as told by your health care provider. This is important. SEEK MEDICAL CARE IF:  The pain gets worse.  You have a fever. SEEK IMMEDIATE MEDICAL CARE IF:  You develop severe joint pain, swelling, or redness.  Many joints become painful and swollen.  You develop severe back pain.  You develop severe weakness in your leg.  You cannot control your bladder or bowels.   This information is not intended to replace advice given to you by your health care provider. Make sure you discuss any questions you have with your health care provider.   Document Released: 04/02/2004 Document Revised: 11/14/2014 Document Reviewed: 05/21/2014 Elsevier Interactive Patient Education [employer].

## 2015-09-20 NOTE — Progress Notes (Signed)
Subjective:    Chief Complaint  Patient presents with  . Annual Exam   CC: none  HPI: Alisha Valencia is a 60 y.o. female who presents to [facility] today a yearly health maintenance exam.  Health Maintenance Summary Reviewed and updated, unless pt declines services. Alcohol:        No concerns, no excessive use STD concerns:     None, not sex active since 2006. No desire to be, no sign other Drug Use:     None; never smoked Birth control method:  Not needed Lumps or breast concerns:      no No concerning skin lesions:  Goes for yrly with her Derm- Dr Delman Cheadle  Patient complains of some mild arthritis pains in her bilateral hands. In the past she has used some over-the-counter creams with decent relief and asked if that is advisable. States pain is not that bad that she needs oral medications at this time.  Reports a.m. Stiffness occasionally, especially after repeated activities the day prior.  Able to do all ADLs.   Past Medical History  Diagnosis Date  . SVT (supraventricular tachycardia) ([place])   . Hypertension   . Chest pain   . Palpitations       Past Surgical History  Procedure Laterality Date  . Tubal ligation    . Tonsillectomy    . Dilatation & currettage/hysteroscopy with resectocope      x2  . Nuclear stress test  06/10/2012    Normal  . Appendectomy        Family History  Problem Relation Age of Onset  . Heart failure Father   . Diabetes Sister   . Heart attack Father       History  Drug Use No  ,   History  Alcohol Use No  ,   History  Smoking status  . Never Smoker   Smokeless tobacco  . Never Used  ,   History  Sexual Activity  . Sexual Activity: Not on file    Current Outpatient Prescriptions on File Prior to Visit  Medication Sig Dispense Refill  . omeprazole (PRILOSEC) 40 MG capsule Take one tab twice daily 2 wks, then one tab daily 60 capsule 1   No current facility-administered  medications on file prior to visit.     Erythromycin    Review of Systems:  ( Completed via her adult medical history intake form today ) General:  Denies fever, chills, appetite changes, unexplained weight loss.  Respiratory: Denies SOB, DOE, cough, wheezing.  Cardiovascular: Denies chest pain, palpitations.  Gastrointestinal: Denies nausea, vomiting, diarrhea, abdominal pain.  Genitourinary: Denies dysuria, increased frequency, flank pain. Endocrine: Denies hot or cold intolerance, polyuria, polydipsia. Musculoskeletal: Denies myalgias, back pain, joint swelling, arthralgias, gait problems.  Skin: Denies pallor, rash, suspicious lesions.  Neurological: Denies dizziness, seizures, syncope, unexplained weakness, lightheadedness, numbness and headaches.  Psychiatric/Behavioral: Denies mood changes, suicidal or homicidal ideations, hallucinations, sleep disturbances.   Objective:     Filed Vitals:   09/20/15 0848  Height: 5' 5.5" (1.664 m)  Weight: 137 lb 8 oz (62.37 kg)   Blood pressure 114/72, pulse 71, height 5' 5.5" (1.664 m), weight 137 lb 8 oz (62.37 kg). Body mass index is 22.53 kg/(m^2). General Appearance:    Alert, cooperative, no distress, appears stated age  Head:    Normocephalic, without obvious abnormality, atraumatic  Eyes:    PERRL, conjunctiva/corneas clear, EOM's intact, fundi  benign, both eyes  Ears:    Normal TM's and external ear canals, both ears  Nose:   Nares normal, septum midline, mucosa normal, no drainage    or sinus tenderness  Throat:   Lips w/o lesion, mucosa moist, and tongue normal; teeth and   gums normal  Neck:   Supple, symmetrical, trachea midline, no adenopathy;    thyroid:  no enlargement/tenderness/nodules; no carotid   bruit or JVD  Back:     Symmetric, no curvature, ROM normal, no CVA tenderness  Lungs:     Clear to auscultation bilaterally, respirations unlabored, no       Wh/ R/ R  Chest Wall:    No tenderness or gross  deformity; normal excursion   Heart:    Regular rate and rhythm, S1 and S2 normal, no murmur, rub   or gallop  Breast Exam:    No tenderness, masses, or nipple abnormality b/l; no d/c  Abdomen:     Soft, non-tender, bowel sounds active all four quadrants, NO   G/R/R, no masses, no organomegaly  Genitalia:    Ext genitalia: without lesion, no rash or discharge, No         tenderness;  Cervix: WNL's w/o discharge or lesion;        Adnexa:  No tenderness or palpable masses   Rectal:    deferred  Extremities:   Extremities normal, atraumatic, no cyanosis or gross edema  Pulses:   2+ and symmetric all extremities  Skin:   Warm, dry, Skin color, texture, turgor normal, no obvious rashes or lesions  Neurologic:   CNII-XII intact, normal strength, sensation and reflexes    Throughout     Recent Results (from the past 2160 hour(s))  CBC with Differential/Platelet     Status: Abnormal   Collection Time: 09/09/15  8:16 AM  Result Value Ref Range   WBC 4.8 3.8 - 10.8 K/uL   RBC 3.90 3.80 - 5.10 MIL/uL   Hemoglobin 11.5 (L) 11.7 - 15.5 g/dL   HCT 34.9 (L) 35.0 - 45.0 %   MCV 89.5 80.0 - 100.0 fL   MCH 29.5 27.0 - 33.0 pg   MCHC 33.0 32.0 - 36.0 g/dL   RDW 13.0 11.0 - 15.0 %   Platelets 241 140 - 400 K/uL   MPV 8.9 7.5 - 12.5 fL   Neutro Abs 2304 1500 - 7800 cells/uL   Lymphs Abs 1968 850 - 3900 cells/uL   Monocytes Absolute 432 200 - 950 cells/uL   Eosinophils Absolute 96 15 - 500 cells/uL   Basophils Absolute 0 0 - 200 cells/uL   Neutrophils Relative % 48 %   Lymphocytes Relative 41 %   Monocytes Relative 9 %   Eosinophils Relative 2 %   Basophils Relative 0 %   Smear Review Criteria for review not met     Comment: ** Please note change in unit of measure and reference range(s). **  Comprehensive metabolic panel     Status: None   Collection Time: 09/09/15  8:16 AM  Result Value Ref Range   Sodium 143 135 - 146 mmol/L   Potassium 4.8 3.5 - 5.3 mmol/L   Chloride 107 98 - 110 mmol/L     CO2 29 20 - 31 mmol/L   Glucose, Bld 89 65 - 99 mg/dL   BUN 20 7 - 25 mg/dL   Creat 0.77 0.50 - 1.05 mg/dL    Comment:   For patients > or = 59 years  of age: The upper reference limit for Creatinine is approximately 13% higher for people identified as African-American.      Total Bilirubin 0.5 0.2 - 1.2 mg/dL   Alkaline Phosphatase 75 33 - 130 U/L   AST 20 10 - 35 U/L   ALT 18 6 - 29 U/L   Total Protein 6.6 6.1 - 8.1 g/dL   Albumin 4.0 3.6 - 5.1 g/dL   Calcium 9.2 8.6 - 10.4 mg/dL  TSH     Status: None   Collection Time: 09/09/15  8:16 AM  Result Value Ref Range   TSH 1.99 mIU/L    Comment:   Reference Range   > or = 20 Years  0.40-4.50   Pregnancy Range First trimester  0.26-2.66 Second trimester 0.55-2.73 Third trimester  0.43-2.91     Hemoglobin A1c     Status: None   Collection Time: 09/09/15  8:16 AM  Result Value Ref Range   Hgb A1c MFr Bld 5.6 <5.7 %    Comment:   For the purpose of screening for the presence of diabetes:   <5.7%       Consistent with the absence of diabetes 5.7-6.4 %   Consistent with increased risk for diabetes (prediabetes) >=6.5 %     Consistent with diabetes   This assay result is consistent with a decreased risk of diabetes.   Currently, no consensus exists regarding use of hemoglobin A1c for diagnosis of diabetes in children.   According to American Diabetes Association (ADA) guidelines, hemoglobin A1c <7.0% represents optimal control in non-pregnant diabetic patients. Different metrics may apply to specific patient populations. Standards of Medical Care in Diabetes (ADA).      Mean Plasma Glucose 114 mg/dL  VITAMIN D 25 Hydroxy (Vit-D Deficiency, Fractures)     Status: None   Collection Time: 09/09/15  8:16 AM  Result Value Ref Range   Vit D, 25-Hydroxy 32 30 - 100 ng/mL    Comment: Vitamin D Status           25-OH Vitamin D        Deficiency                <20 ng/mL        Insufficiency         20 - 29 ng/mL         Optimal             > or = 30 ng/mL   For 25-OH Vitamin D testing on patients on D2-supplementation and patients for whom quantitation of D2 and D3 fractions is required, the QuestAssureD 25-OH VIT D, (D2,D3), LC/MS/MS is recommended: order code [id number] (patients > 2 yrs).   Lipid panel     Status: None   Collection Time: 09/09/15  8:16 AM  Result Value Ref Range   Cholesterol 199 125 - 200 mg/dL   Triglycerides 74 <150 mg/dL   HDL 61 >=46 mg/dL   Total CHOL/HDL Ratio 3.3 <=5.0 Ratio   VLDL 15 <30 mg/dL   LDL Cholesterol 123 <130 mg/dL    Comment:   Total Cholesterol/HDL Ratio:CHD Risk                        Coronary Heart Disease Risk Table  Men       Women          1/2 Average Risk              3.4        3.3              Average Risk              5.0        4.4           2X Average Risk              9.6        7.1           3X Average Risk             23.4       11.0 Use the calculated Patient Ratio above and the CHD Risk table  to determine the patient's CHD Risk.      Impression and Recommendations:    1. Well woman exam with routine gynecological exam   2. h/o Essential hypertension   3. Arthritis of both hands   4. Gastroesophageal reflux disease, esophagitis presence not specified   5. Solitary pulmonary nodule   6. Family history of diabetes mellitus in first degree relative   7. h/o Squamous cell skin cancer   8. Need for hepatitis C screening test   9. Screening for HIV (human immunodeficiency virus)    - WNL exam - BP- well controlled - penetrex OTC cream prn; pt declines oral meds, handouts given - continue omeprazole daily - Follow-up chest x-ray recently performed was normal. - Recent review of labs show no evidence of diabetes or even prediabetes-    1) Anticipatory Guidance: Discussed importance of wearing a seatbelt while driving, not texting while driving; changing batteries in smoke detector at least once  annually; sunscreen when outside along with skin surveillance; eating a balanced and modest diet; physical activity at least 25 minutes per day or 150 min/ week moderate to intense activity.  2) Immunizations / Screenings / Labs:  All immunizations are up-to-date per recommendations or will be updated today. Patient is due for dental and vision screens which pt will schedule independently. Obtain CBC, CMP, HgA1c, Lipid panel, TSH and vit D when fasting.   3) Weight:  BMI does indicate normal wt.   Discussed goal of losing 5-10% of current body weight which would improve overall feelings of well being and improve objective health data. Improve nutrient density of diet through increasing intake of fruits and vegetables and decreasing saturated fats, white flour products and refined sugars.   Gross side effects, risk and benefits, and alternatives of medications discussed with patient.  Patient is aware that all medications have potential side effects and we are unable to predict every sideeffect or drug-drug interaction that may occur.  Expresses verbal understanding and consents to current therapy plan and treatment regiment.  Follow-up preventative CPE in 1 year. Follow-up office visit pending lab work.  F/up sooner for chronic care management and/or prn  Please see orders placed and AVS handed out to patient at the end of our visit for further patient instructions/ counseling done pertaining to today's office visit.

## 2015-09-29 ENCOUNTER — Encounter: Payer: Self-pay | Admitting: Family Medicine

## 2015-09-29 DIAGNOSIS — Z01419 Encounter for gynecological examination (general) (routine) without abnormal findings: Secondary | ICD-10-CM | POA: Insufficient documentation

## 2015-09-29 DIAGNOSIS — I10 Essential (primary) hypertension: Secondary | ICD-10-CM | POA: Insufficient documentation

## 2015-09-29 DIAGNOSIS — M19041 Primary osteoarthritis, right hand: Secondary | ICD-10-CM | POA: Insufficient documentation

## 2015-09-29 DIAGNOSIS — M19042 Primary osteoarthritis, left hand: Secondary | ICD-10-CM

## 2015-10-09 ENCOUNTER — Encounter: Payer: Self-pay | Admitting: Family Medicine

## 2016-02-18 DIAGNOSIS — Z6823 Body mass index (BMI) 23.0-23.9, adult: Secondary | ICD-10-CM | POA: Diagnosis not present

## 2016-02-18 DIAGNOSIS — Z1231 Encounter for screening mammogram for malignant neoplasm of breast: Secondary | ICD-10-CM | POA: Diagnosis not present

## 2016-02-18 DIAGNOSIS — Z113 Encounter for screening for infections with a predominantly sexual mode of transmission: Secondary | ICD-10-CM | POA: Diagnosis not present

## 2016-02-18 DIAGNOSIS — Z01419 Encounter for gynecological examination (general) (routine) without abnormal findings: Secondary | ICD-10-CM | POA: Diagnosis not present

## 2016-02-18 DIAGNOSIS — Z1151 Encounter for screening for human papillomavirus (HPV): Secondary | ICD-10-CM | POA: Diagnosis not present

## 2016-02-21 DIAGNOSIS — N6489 Other specified disorders of breast: Secondary | ICD-10-CM | POA: Diagnosis not present

## 2016-02-27 DIAGNOSIS — Z780 Asymptomatic menopausal state: Secondary | ICD-10-CM | POA: Diagnosis not present

## 2016-02-27 DIAGNOSIS — M8589 Other specified disorders of bone density and structure, multiple sites: Secondary | ICD-10-CM | POA: Diagnosis not present

## 2016-02-27 LAB — HM DEXA SCAN

## 2016-04-14 DIAGNOSIS — R079 Chest pain, unspecified: Secondary | ICD-10-CM | POA: Diagnosis not present

## 2016-04-16 ENCOUNTER — Ambulatory Visit: Payer: BLUE CROSS/BLUE SHIELD | Admitting: Cardiovascular Disease

## 2016-06-15 DIAGNOSIS — S0031XA Abrasion of nose, initial encounter: Secondary | ICD-10-CM | POA: Diagnosis not present

## 2016-06-15 DIAGNOSIS — S40011A Contusion of right shoulder, initial encounter: Secondary | ICD-10-CM | POA: Diagnosis not present

## 2016-08-20 DIAGNOSIS — Z803 Family history of malignant neoplasm of breast: Secondary | ICD-10-CM | POA: Diagnosis not present

## 2016-08-20 DIAGNOSIS — N6489 Other specified disorders of breast: Secondary | ICD-10-CM | POA: Diagnosis not present

## 2016-09-01 DIAGNOSIS — D2272 Melanocytic nevi of left lower limb, including hip: Secondary | ICD-10-CM | POA: Diagnosis not present

## 2016-09-01 DIAGNOSIS — D225 Melanocytic nevi of trunk: Secondary | ICD-10-CM | POA: Diagnosis not present

## 2016-09-01 DIAGNOSIS — R202 Paresthesia of skin: Secondary | ICD-10-CM | POA: Diagnosis not present

## 2016-09-01 DIAGNOSIS — Z808 Family history of malignant neoplasm of other organs or systems: Secondary | ICD-10-CM | POA: Diagnosis not present

## 2016-09-24 DIAGNOSIS — M9905 Segmental and somatic dysfunction of pelvic region: Secondary | ICD-10-CM | POA: Diagnosis not present

## 2016-09-24 DIAGNOSIS — M9901 Segmental and somatic dysfunction of cervical region: Secondary | ICD-10-CM | POA: Diagnosis not present

## 2016-09-24 DIAGNOSIS — Q72812 Congenital shortening of left lower limb: Secondary | ICD-10-CM | POA: Diagnosis not present

## 2016-09-24 DIAGNOSIS — M50322 Other cervical disc degeneration at C5-C6 level: Secondary | ICD-10-CM | POA: Diagnosis not present

## 2016-09-28 DIAGNOSIS — Q72812 Congenital shortening of left lower limb: Secondary | ICD-10-CM | POA: Diagnosis not present

## 2016-09-28 DIAGNOSIS — M50322 Other cervical disc degeneration at C5-C6 level: Secondary | ICD-10-CM | POA: Diagnosis not present

## 2016-09-28 DIAGNOSIS — M9901 Segmental and somatic dysfunction of cervical region: Secondary | ICD-10-CM | POA: Diagnosis not present

## 2016-09-28 DIAGNOSIS — M9905 Segmental and somatic dysfunction of pelvic region: Secondary | ICD-10-CM | POA: Diagnosis not present

## 2016-09-30 DIAGNOSIS — M9905 Segmental and somatic dysfunction of pelvic region: Secondary | ICD-10-CM | POA: Diagnosis not present

## 2016-09-30 DIAGNOSIS — M9901 Segmental and somatic dysfunction of cervical region: Secondary | ICD-10-CM | POA: Diagnosis not present

## 2016-09-30 DIAGNOSIS — M50322 Other cervical disc degeneration at C5-C6 level: Secondary | ICD-10-CM | POA: Diagnosis not present

## 2016-09-30 DIAGNOSIS — Q72812 Congenital shortening of left lower limb: Secondary | ICD-10-CM | POA: Diagnosis not present

## 2016-10-01 DIAGNOSIS — M50322 Other cervical disc degeneration at C5-C6 level: Secondary | ICD-10-CM | POA: Diagnosis not present

## 2016-10-01 DIAGNOSIS — Q72812 Congenital shortening of left lower limb: Secondary | ICD-10-CM | POA: Diagnosis not present

## 2016-10-01 DIAGNOSIS — M9905 Segmental and somatic dysfunction of pelvic region: Secondary | ICD-10-CM | POA: Diagnosis not present

## 2016-10-01 DIAGNOSIS — M9901 Segmental and somatic dysfunction of cervical region: Secondary | ICD-10-CM | POA: Diagnosis not present

## 2016-10-05 DIAGNOSIS — Q72812 Congenital shortening of left lower limb: Secondary | ICD-10-CM | POA: Diagnosis not present

## 2016-10-05 DIAGNOSIS — M9905 Segmental and somatic dysfunction of pelvic region: Secondary | ICD-10-CM | POA: Diagnosis not present

## 2016-10-05 DIAGNOSIS — M50322 Other cervical disc degeneration at C5-C6 level: Secondary | ICD-10-CM | POA: Diagnosis not present

## 2016-10-05 DIAGNOSIS — M9901 Segmental and somatic dysfunction of cervical region: Secondary | ICD-10-CM | POA: Diagnosis not present

## 2016-10-06 DIAGNOSIS — Q72812 Congenital shortening of left lower limb: Secondary | ICD-10-CM | POA: Diagnosis not present

## 2016-10-06 DIAGNOSIS — M9905 Segmental and somatic dysfunction of pelvic region: Secondary | ICD-10-CM | POA: Diagnosis not present

## 2016-10-06 DIAGNOSIS — M9901 Segmental and somatic dysfunction of cervical region: Secondary | ICD-10-CM | POA: Diagnosis not present

## 2016-10-06 DIAGNOSIS — M50322 Other cervical disc degeneration at C5-C6 level: Secondary | ICD-10-CM | POA: Diagnosis not present

## 2016-10-08 DIAGNOSIS — M50322 Other cervical disc degeneration at C5-C6 level: Secondary | ICD-10-CM | POA: Diagnosis not present

## 2016-10-08 DIAGNOSIS — M9905 Segmental and somatic dysfunction of pelvic region: Secondary | ICD-10-CM | POA: Diagnosis not present

## 2016-10-08 DIAGNOSIS — M9901 Segmental and somatic dysfunction of cervical region: Secondary | ICD-10-CM | POA: Diagnosis not present

## 2016-10-08 DIAGNOSIS — Q72812 Congenital shortening of left lower limb: Secondary | ICD-10-CM | POA: Diagnosis not present

## 2016-10-20 DIAGNOSIS — Q72812 Congenital shortening of left lower limb: Secondary | ICD-10-CM | POA: Diagnosis not present

## 2016-10-20 DIAGNOSIS — M9905 Segmental and somatic dysfunction of pelvic region: Secondary | ICD-10-CM | POA: Diagnosis not present

## 2016-10-20 DIAGNOSIS — M9901 Segmental and somatic dysfunction of cervical region: Secondary | ICD-10-CM | POA: Diagnosis not present

## 2016-10-20 DIAGNOSIS — M50322 Other cervical disc degeneration at C5-C6 level: Secondary | ICD-10-CM | POA: Diagnosis not present

## 2016-10-22 DIAGNOSIS — Q72812 Congenital shortening of left lower limb: Secondary | ICD-10-CM | POA: Diagnosis not present

## 2016-10-22 DIAGNOSIS — M50322 Other cervical disc degeneration at C5-C6 level: Secondary | ICD-10-CM | POA: Diagnosis not present

## 2016-10-22 DIAGNOSIS — M9905 Segmental and somatic dysfunction of pelvic region: Secondary | ICD-10-CM | POA: Diagnosis not present

## 2016-10-22 DIAGNOSIS — M9901 Segmental and somatic dysfunction of cervical region: Secondary | ICD-10-CM | POA: Diagnosis not present

## 2016-10-27 DIAGNOSIS — M9905 Segmental and somatic dysfunction of pelvic region: Secondary | ICD-10-CM | POA: Diagnosis not present

## 2016-10-27 DIAGNOSIS — Q72812 Congenital shortening of left lower limb: Secondary | ICD-10-CM | POA: Diagnosis not present

## 2016-10-27 DIAGNOSIS — M47812 Spondylosis without myelopathy or radiculopathy, cervical region: Secondary | ICD-10-CM | POA: Diagnosis not present

## 2016-10-27 DIAGNOSIS — M9901 Segmental and somatic dysfunction of cervical region: Secondary | ICD-10-CM | POA: Diagnosis not present

## 2016-10-27 DIAGNOSIS — M47819 Spondylosis without myelopathy or radiculopathy, site unspecified: Secondary | ICD-10-CM | POA: Diagnosis not present

## 2016-10-27 DIAGNOSIS — M50322 Other cervical disc degeneration at C5-C6 level: Secondary | ICD-10-CM | POA: Diagnosis not present

## 2016-10-29 DIAGNOSIS — M50322 Other cervical disc degeneration at C5-C6 level: Secondary | ICD-10-CM | POA: Diagnosis not present

## 2016-10-29 DIAGNOSIS — Q72812 Congenital shortening of left lower limb: Secondary | ICD-10-CM | POA: Diagnosis not present

## 2016-10-29 DIAGNOSIS — M9901 Segmental and somatic dysfunction of cervical region: Secondary | ICD-10-CM | POA: Diagnosis not present

## 2016-10-29 DIAGNOSIS — M9905 Segmental and somatic dysfunction of pelvic region: Secondary | ICD-10-CM | POA: Diagnosis not present

## 2016-11-02 DIAGNOSIS — H43813 Vitreous degeneration, bilateral: Secondary | ICD-10-CM | POA: Diagnosis not present

## 2016-11-02 DIAGNOSIS — H5203 Hypermetropia, bilateral: Secondary | ICD-10-CM | POA: Diagnosis not present

## 2016-11-02 DIAGNOSIS — H524 Presbyopia: Secondary | ICD-10-CM | POA: Diagnosis not present

## 2016-11-03 DIAGNOSIS — M50322 Other cervical disc degeneration at C5-C6 level: Secondary | ICD-10-CM | POA: Diagnosis not present

## 2016-11-03 DIAGNOSIS — M9905 Segmental and somatic dysfunction of pelvic region: Secondary | ICD-10-CM | POA: Diagnosis not present

## 2016-11-03 DIAGNOSIS — M9901 Segmental and somatic dysfunction of cervical region: Secondary | ICD-10-CM | POA: Diagnosis not present

## 2016-11-03 DIAGNOSIS — Q72812 Congenital shortening of left lower limb: Secondary | ICD-10-CM | POA: Diagnosis not present

## 2016-11-05 DIAGNOSIS — M9901 Segmental and somatic dysfunction of cervical region: Secondary | ICD-10-CM | POA: Diagnosis not present

## 2016-11-05 DIAGNOSIS — M9905 Segmental and somatic dysfunction of pelvic region: Secondary | ICD-10-CM | POA: Diagnosis not present

## 2016-11-05 DIAGNOSIS — M50322 Other cervical disc degeneration at C5-C6 level: Secondary | ICD-10-CM | POA: Diagnosis not present

## 2016-11-05 DIAGNOSIS — Q72812 Congenital shortening of left lower limb: Secondary | ICD-10-CM | POA: Diagnosis not present

## 2016-11-10 DIAGNOSIS — M9905 Segmental and somatic dysfunction of pelvic region: Secondary | ICD-10-CM | POA: Diagnosis not present

## 2016-11-10 DIAGNOSIS — M50322 Other cervical disc degeneration at C5-C6 level: Secondary | ICD-10-CM | POA: Diagnosis not present

## 2016-11-10 DIAGNOSIS — Q72812 Congenital shortening of left lower limb: Secondary | ICD-10-CM | POA: Diagnosis not present

## 2016-11-10 DIAGNOSIS — M9901 Segmental and somatic dysfunction of cervical region: Secondary | ICD-10-CM | POA: Diagnosis not present

## 2016-11-12 DIAGNOSIS — Q72812 Congenital shortening of left lower limb: Secondary | ICD-10-CM | POA: Diagnosis not present

## 2016-11-12 DIAGNOSIS — M9901 Segmental and somatic dysfunction of cervical region: Secondary | ICD-10-CM | POA: Diagnosis not present

## 2016-11-12 DIAGNOSIS — M50322 Other cervical disc degeneration at C5-C6 level: Secondary | ICD-10-CM | POA: Diagnosis not present

## 2016-11-12 DIAGNOSIS — M9905 Segmental and somatic dysfunction of pelvic region: Secondary | ICD-10-CM | POA: Diagnosis not present

## 2016-11-17 DIAGNOSIS — Q72812 Congenital shortening of left lower limb: Secondary | ICD-10-CM | POA: Diagnosis not present

## 2016-11-17 DIAGNOSIS — M50322 Other cervical disc degeneration at C5-C6 level: Secondary | ICD-10-CM | POA: Diagnosis not present

## 2016-11-17 DIAGNOSIS — M9901 Segmental and somatic dysfunction of cervical region: Secondary | ICD-10-CM | POA: Diagnosis not present

## 2016-11-17 DIAGNOSIS — M9905 Segmental and somatic dysfunction of pelvic region: Secondary | ICD-10-CM | POA: Diagnosis not present

## 2016-11-19 DIAGNOSIS — Q72812 Congenital shortening of left lower limb: Secondary | ICD-10-CM | POA: Diagnosis not present

## 2016-11-19 DIAGNOSIS — M50322 Other cervical disc degeneration at C5-C6 level: Secondary | ICD-10-CM | POA: Diagnosis not present

## 2016-11-19 DIAGNOSIS — M9901 Segmental and somatic dysfunction of cervical region: Secondary | ICD-10-CM | POA: Diagnosis not present

## 2016-11-19 DIAGNOSIS — M9905 Segmental and somatic dysfunction of pelvic region: Secondary | ICD-10-CM | POA: Diagnosis not present

## 2016-11-20 IMAGING — CR DG CHEST 2V
2 series · 2 of 2 positions shown · non-contrast
Comparison: Chest radiograph 10/25/2014.

CLINICAL DATA: Patient with dry cough.  History of pneumonia.

EXAM:
CHEST  2 VIEW

[chest pa]
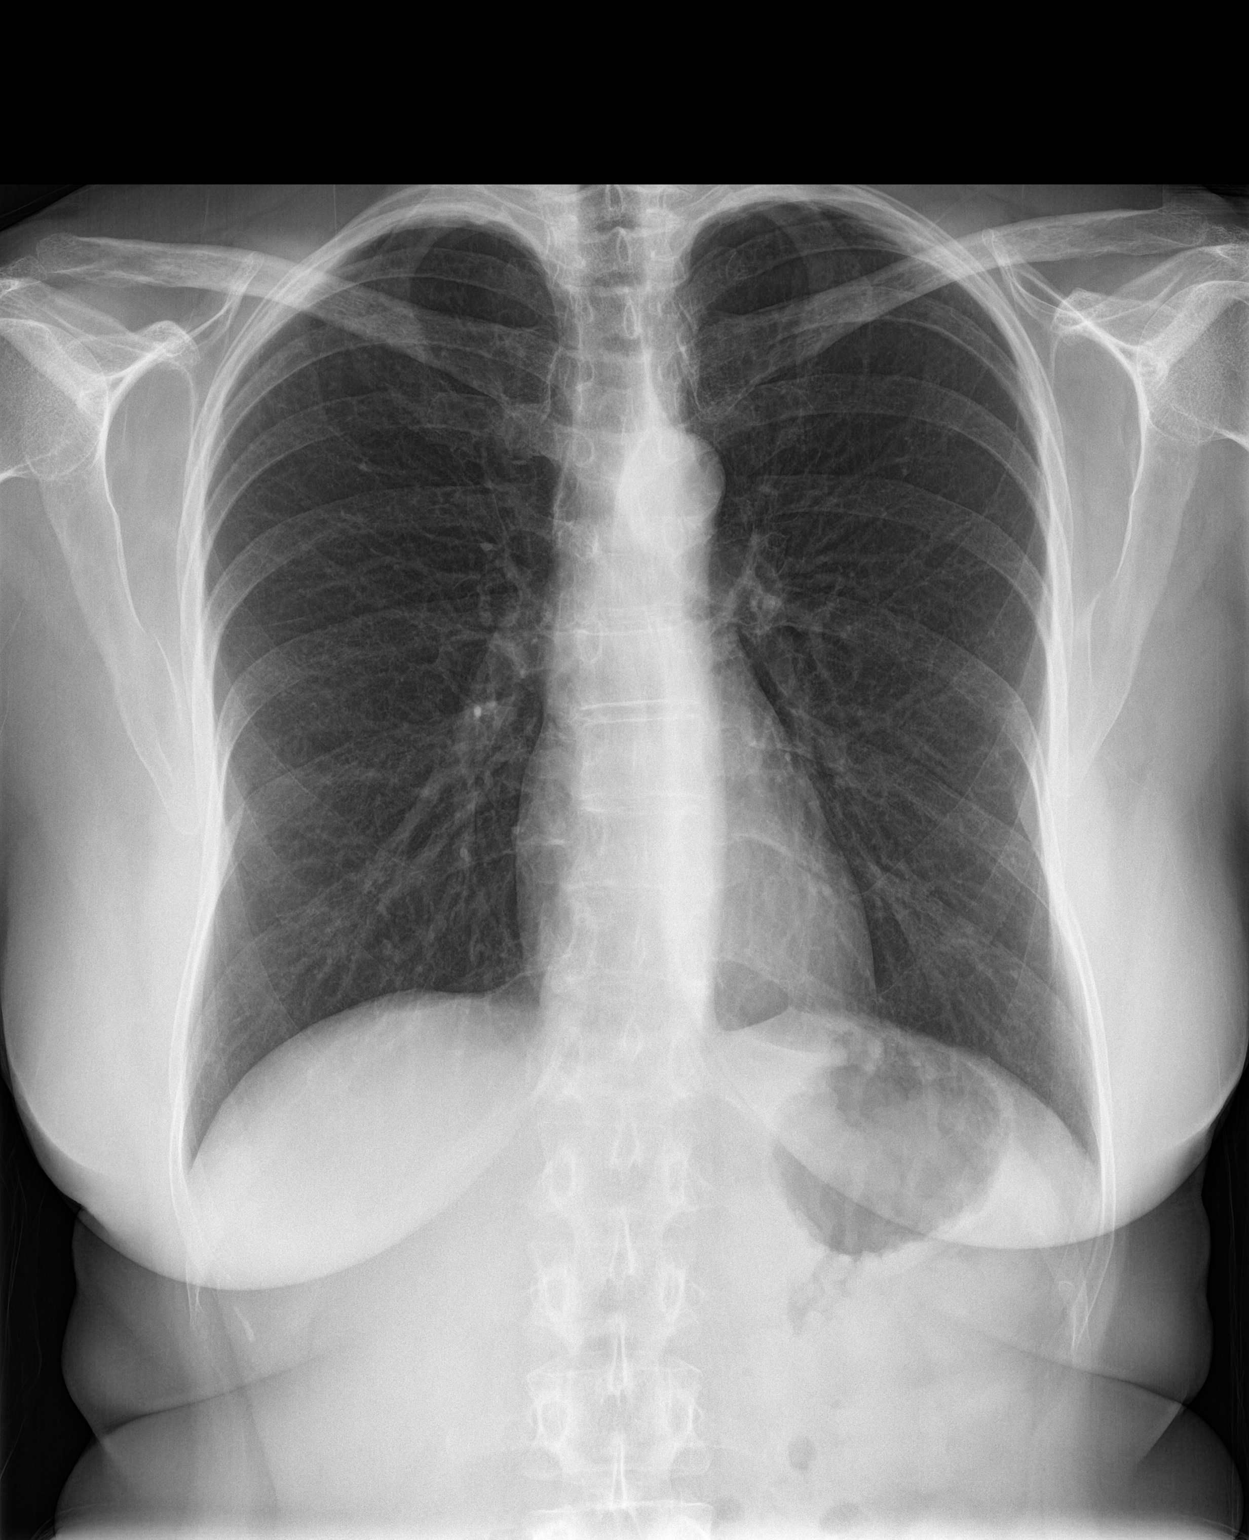

[chest lat]
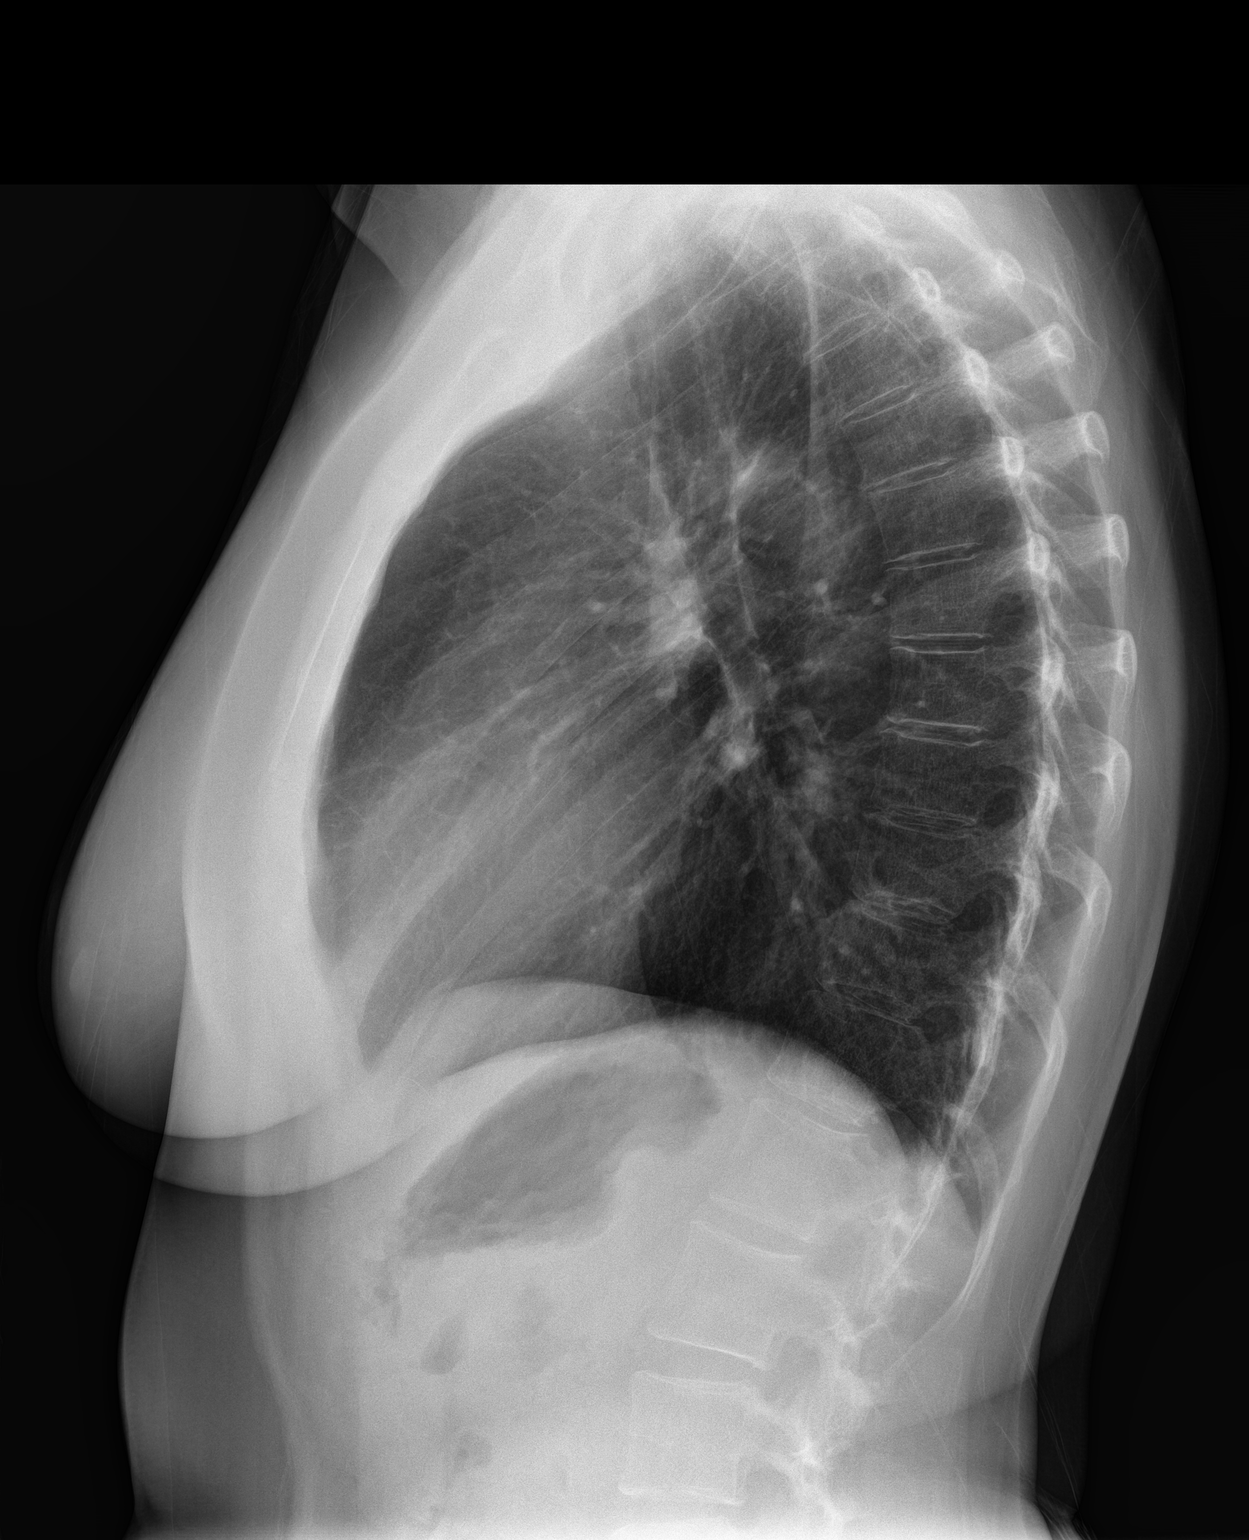

[2 of 2 positions shown; findings below may reference images not displayed]

FINDINGS: Stable cardiac and mediastinal contours. No consolidative pulmonary
opacities. No pleural effusion pneumothorax. Mid thoracic spine
degenerative changes.
IMPRESSION: No active cardiopulmonary disease.

## 2016-11-26 DIAGNOSIS — M50322 Other cervical disc degeneration at C5-C6 level: Secondary | ICD-10-CM | POA: Diagnosis not present

## 2016-11-26 DIAGNOSIS — M9905 Segmental and somatic dysfunction of pelvic region: Secondary | ICD-10-CM | POA: Diagnosis not present

## 2016-11-26 DIAGNOSIS — M9901 Segmental and somatic dysfunction of cervical region: Secondary | ICD-10-CM | POA: Diagnosis not present

## 2016-11-26 DIAGNOSIS — Q72812 Congenital shortening of left lower limb: Secondary | ICD-10-CM | POA: Diagnosis not present

## 2016-12-03 DIAGNOSIS — M9901 Segmental and somatic dysfunction of cervical region: Secondary | ICD-10-CM | POA: Diagnosis not present

## 2016-12-03 DIAGNOSIS — Q72812 Congenital shortening of left lower limb: Secondary | ICD-10-CM | POA: Diagnosis not present

## 2016-12-03 DIAGNOSIS — M9905 Segmental and somatic dysfunction of pelvic region: Secondary | ICD-10-CM | POA: Diagnosis not present

## 2016-12-03 DIAGNOSIS — M50322 Other cervical disc degeneration at C5-C6 level: Secondary | ICD-10-CM | POA: Diagnosis not present

## 2016-12-07 ENCOUNTER — Other Ambulatory Visit: Payer: Self-pay

## 2016-12-07 DIAGNOSIS — Z833 Family history of diabetes mellitus: Secondary | ICD-10-CM

## 2016-12-07 DIAGNOSIS — Z0000 Encounter for general adult medical examination without abnormal findings: Secondary | ICD-10-CM

## 2016-12-11 ENCOUNTER — Other Ambulatory Visit: Payer: Self-pay

## 2016-12-11 DIAGNOSIS — Z1159 Encounter for screening for other viral diseases: Secondary | ICD-10-CM

## 2016-12-11 DIAGNOSIS — Z833 Family history of diabetes mellitus: Secondary | ICD-10-CM

## 2016-12-11 DIAGNOSIS — Z0000 Encounter for general adult medical examination without abnormal findings: Secondary | ICD-10-CM

## 2016-12-11 NOTE — Addendum Note (Signed)
Addended by: Fonnie Mu on: 12/11/2016 09:07 AM   Modules accepted: Orders

## 2016-12-12 LAB — COMPREHENSIVE METABOLIC PANEL
ALBUMIN: 4.2 g/dL (ref 3.6–4.8)
ALT: 18 IU/L (ref 0–32)
AST: 19 IU/L (ref 0–40)
Albumin/Globulin Ratio: 1.4 (ref 1.2–2.2)
Alkaline Phosphatase: 97 IU/L (ref 39–117)
BUN / CREAT RATIO: 22 (ref 12–28)
BUN: 18 mg/dL (ref 8–27)
Bilirubin Total: 0.3 mg/dL (ref 0.0–1.2)
CO2: 25 mmol/L (ref 20–29)
CREATININE: 0.81 mg/dL (ref 0.57–1.00)
Calcium: 9.3 mg/dL (ref 8.7–10.3)
Chloride: 103 mmol/L (ref 96–106)
GFR calc non Af Amer: 79 mL/min/{1.73_m2} (ref >59)
GFR, EST AFRICAN AMERICAN: 91 mL/min/{1.73_m2} (ref >59)
GLUCOSE: 85 mg/dL (ref 65–99)
Globulin, Total: 2.9 g/dL (ref 1.5–4.5)
Potassium: 4.2 mmol/L (ref 3.5–5.2)
Sodium: 143 mmol/L (ref 134–144)
TOTAL PROTEIN: 7.1 g/dL (ref 6.0–8.5)

## 2016-12-12 LAB — CBC WITH DIFFERENTIAL/PLATELET
Basophils Absolute: 0 10*3/uL (ref 0.0–0.2)
Basos: 0 %
EOS (ABSOLUTE): 0.1 10*3/uL (ref 0.0–0.4)
EOS: 1 %
HEMOGLOBIN: 11.6 g/dL (ref 11.1–15.9)
Hematocrit: 35 % (ref 34.0–46.6)
IMMATURE GRANS (ABS): 0 10*3/uL (ref 0.0–0.1)
Immature Granulocytes: 0 %
LYMPHS: 35 %
Lymphocytes Absolute: 1.9 10*3/uL (ref 0.7–3.1)
MCH: 29.1 pg (ref 26.6–33.0)
MCHC: 33.1 g/dL (ref 31.5–35.7)
MCV: 88 fL (ref 79–97)
MONOCYTES: 7 %
Monocytes Absolute: 0.4 10*3/uL (ref 0.1–0.9)
NEUTROS ABS: 3.1 10*3/uL (ref 1.4–7.0)
Neutrophils: 57 %
Platelets: 297 10*3/uL (ref 150–379)
RBC: 3.98 x10E6/uL (ref 3.77–5.28)
RDW: 13.4 % (ref 12.3–15.4)
WBC: 5.5 10*3/uL (ref 3.4–10.8)

## 2016-12-12 LAB — LIPID PANEL
CHOL/HDL RATIO: 3.3 ratio (ref 0.0–4.4)
Cholesterol, Total: 191 mg/dL (ref 100–199)
HDL: 58 mg/dL (ref >39)
LDL CALC: 120 mg/dL — AB (ref 0–99)
TRIGLYCERIDES: 66 mg/dL (ref 0–149)
VLDL Cholesterol Cal: 13 mg/dL (ref 5–40)

## 2016-12-12 LAB — HEPATITIS C ANTIBODY

## 2016-12-12 LAB — VITAMIN D 25 HYDROXY (VIT D DEFICIENCY, FRACTURES): VIT D 25 HYDROXY: 31.2 ng/mL (ref 30.0–100.0)

## 2016-12-12 LAB — HEMOGLOBIN A1C
Est. average glucose Bld gHb Est-mCnc: 111 mg/dL
Hgb A1c MFr Bld: 5.5 % (ref 4.8–5.6)

## 2016-12-12 LAB — VITAMIN B12: Vitamin B-12: 539 pg/mL (ref 232–1245)

## 2016-12-12 LAB — TSH: TSH: 2.39 u[IU]/mL (ref 0.450–4.500)

## 2016-12-14 ENCOUNTER — Encounter: Payer: Self-pay | Admitting: Family Medicine

## 2016-12-17 DIAGNOSIS — M9901 Segmental and somatic dysfunction of cervical region: Secondary | ICD-10-CM | POA: Diagnosis not present

## 2016-12-17 DIAGNOSIS — Q72812 Congenital shortening of left lower limb: Secondary | ICD-10-CM | POA: Diagnosis not present

## 2016-12-17 DIAGNOSIS — M50322 Other cervical disc degeneration at C5-C6 level: Secondary | ICD-10-CM | POA: Diagnosis not present

## 2016-12-17 DIAGNOSIS — M9905 Segmental and somatic dysfunction of pelvic region: Secondary | ICD-10-CM | POA: Diagnosis not present

## 2016-12-31 ENCOUNTER — Ambulatory Visit (INDEPENDENT_AMBULATORY_CARE_PROVIDER_SITE_OTHER): Payer: BLUE CROSS/BLUE SHIELD | Admitting: Family Medicine

## 2016-12-31 ENCOUNTER — Encounter: Payer: Self-pay | Admitting: Family Medicine

## 2016-12-31 VITALS — BP 125/81 | HR 66 | Ht 65.5 in | Wt 143.8 lb

## 2016-12-31 DIAGNOSIS — E559 Vitamin D deficiency, unspecified: Secondary | ICD-10-CM

## 2016-12-31 DIAGNOSIS — Z1389 Encounter for screening for other disorder: Secondary | ICD-10-CM

## 2016-12-31 DIAGNOSIS — Z0000 Encounter for general adult medical examination without abnormal findings: Secondary | ICD-10-CM

## 2016-12-31 DIAGNOSIS — Z719 Counseling, unspecified: Secondary | ICD-10-CM

## 2016-12-31 NOTE — Patient Instructions (Signed)
Preventive Care for Adults, Female  A healthy lifestyle and preventive care can promote health and wellness. Preventive health guidelines for women include the following key practices.   A routine yearly physical is a good way to check with your health care provider about your health and preventive screening. It is a chance to share any concerns and updates on your health and to receive a thorough exam.   Visit your dentist for a routine exam and preventive care every 6 months. Brush your teeth twice a day and floss once a day. Good oral hygiene prevents tooth decay and gum disease.   The frequency of eye exams is based on your age, health, family medical history, use of contact lenses, and other factors. Follow your health care provider's recommendations for frequency of eye exams.   Eat a healthy diet. Foods like vegetables, fruits, whole grains, low-fat dairy products, and lean protein foods contain the nutrients you need without too many calories. Decrease your intake of foods high in solid fats, added sugars, and salt. Eat the right amount of calories for you.Get information about a proper diet from your health care provider, if necessary.   Regular physical exercise is one of the most important things you can do for your health. Most adults should get at least 150 minutes of moderate-intensity exercise (any activity that increases your heart rate and causes you to sweat) each week. In addition, most adults need muscle-strengthening exercises on 2 or more days a week.   Maintain a healthy weight. The body mass index (BMI) is a screening tool to identify possible weight problems. It provides an estimate of body fat based on height and weight. Your health care provider can find your BMI, and can help you achieve or maintain a healthy weight.For adults 20 years and older:   - A BMI below 18.5 is considered underweight.   - A BMI of 18.5 to 24.9 is normal.   - A BMI of 25 to 29.9 is  considered overweight.   - A BMI of 30 and above is considered obese.   Maintain normal blood lipids and cholesterol levels by exercising and minimizing your intake of trans and saturated fats.  Eat a balanced diet with plenty of fruit and vegetables. Blood tests for lipids and cholesterol should begin at age 20 and be repeated every 5 years minimum.  If your lipid or cholesterol levels are high, you are over 40, or you are at high risk for heart disease, you may need your cholesterol levels checked more frequently.Ongoing high lipid and cholesterol levels should be treated with medicines if diet and exercise are not working.   If you smoke, find out from your health care provider how to quit. If you do not use tobacco, do not start.   Lung cancer screening is recommended for adults aged 55-80 years who are at high risk for developing lung cancer because of a history of smoking. A yearly low-dose CT scan of the lungs is recommended for people who have at least a 30-pack-year history of smoking and are a current smoker or have quit within the past 15 years. A pack year of smoking is smoking an average of 1 pack of cigarettes a day for 1 year (for example: 1 pack a day for 30 years or 2 packs a day for 15 years). Yearly screening should continue until the smoker has stopped smoking for at least 15 years. Yearly screening should be stopped for people who develop a   health problem that would prevent them from having lung cancer treatment.   If you are pregnant, do not drink alcohol. If you are breastfeeding, be very cautious about drinking alcohol. If you are not pregnant and choose to drink alcohol, do not have more than 1 drink per day. One drink is considered to be 12 ounces (355 mL) of beer, 5 ounces (148 mL) of wine, or 1.5 ounces (44 mL) of liquor.   Avoid use of street drugs. Do not share needles with anyone. Ask for help if you need support or instructions about stopping the use of  drugs.   High blood pressure causes heart disease and increases the risk of stroke. Your blood pressure should be checked at least yearly.  Ongoing high blood pressure should be treated with medicines if weight loss and exercise do not work.   If you are 69-55 years old, ask your health care provider if you should take aspirin to prevent strokes.   Diabetes screening involves taking a blood sample to check your fasting blood sugar level. This should be done once every 3 years, after age 38, if you are within normal weight and without risk factors for diabetes. Testing should be considered at a younger age or be carried out more frequently if you are overweight and have at least 1 risk factor for diabetes.   Breast cancer screening is essential preventive care for women. You should practice "breast self-awareness."  This means understanding the normal appearance and feel of your breasts and may include breast self-examination.  Any changes detected, no matter how small, should be reported to a health care provider.  Women in their 80s and 30s should have a clinical breast exam (CBE) by a health care provider as part of a regular health exam every 1 to 3 years.  After age 66, women should have a CBE every year.  Starting at age 1, women should consider having a mammogram (breast X-ray test) every year.  Women who have a family history of breast cancer should talk to their health care provider about genetic screening.  Women at a high risk of breast cancer should talk to their health care providers about having an MRI and a mammogram every year.   -Breast cancer gene (BRCA)-related cancer risk assessment is recommended for women who have family members with BRCA-related cancers. BRCA-related cancers include breast, ovarian, tubal, and peritoneal cancers. Having family members with these cancers may be associated with an increased risk for harmful changes (mutations) in the breast cancer genes BRCA1 and  BRCA2. Results of the assessment will determine the need for genetic counseling and BRCA1 and BRCA2 testing.   The Pap test is a screening test for cervical cancer. A Pap test can show cell changes on the cervix that might become cervical cancer if left untreated. A Pap test is a procedure in which cells are obtained and examined from the lower end of the uterus (cervix).   - Women should have a Pap test starting at age 57.   - Between ages 90 and 70, Pap tests should be repeated every 2 years.   - Beginning at age 63, you should have a Pap test every 3 years as long as the past 3 Pap tests have been normal.   - Some women have medical problems that increase the chance of getting cervical cancer. Talk to your health care provider about these problems. It is especially important to talk to your health care provider if a  new problem develops soon after your last Pap test. In these cases, your health care provider may recommend more frequent screening and Pap tests.   - The above recommendations are the same for women who have or have not gotten the vaccine for human papillomavirus (HPV).   - If you had a hysterectomy for a problem that was not cancer or a condition that could lead to cancer, then you no longer need Pap tests. Even if you no longer need a Pap test, a regular exam is a good idea to make sure no other problems are starting.   - If you are between ages 36 and 66 years, and you have had normal Pap tests going back 10 years, you no longer need Pap tests. Even if you no longer need a Pap test, a regular exam is a good idea to make sure no other problems are starting.   - If you have had past treatment for cervical cancer or a condition that could lead to cancer, you need Pap tests and screening for cancer for at least 20 years after your treatment.   - If Pap tests have been discontinued, risk factors (such as a new sexual partner) need to be reassessed to determine if screening should  be resumed.   - The HPV test is an additional test that may be used for cervical cancer screening. The HPV test looks for the virus that can cause the cell changes on the cervix. The cells collected during the Pap test can be tested for HPV. The HPV test could be used to screen women aged 70 years and older, and should be used in women of any age who have unclear Pap test results. After the age of 67, women should have HPV testing at the same frequency as a Pap test.   Colorectal cancer can be detected and often prevented. Most routine colorectal cancer screening begins at the age of 57 years and continues through age 26 years. However, your health care provider may recommend screening at an earlier age if you have risk factors for colon cancer. On a yearly basis, your health care provider may provide home test kits to check for hidden blood in the stool.  Use of a small camera at the end of a tube, to directly examine the colon (sigmoidoscopy or colonoscopy), can detect the earliest forms of colorectal cancer. Talk to your health care provider about this at age 23, when routine screening begins. Direct exam of the colon should be repeated every 5 -10 years through age 49 years, unless early forms of pre-cancerous polyps or small growths are found.   People who are at an increased risk for hepatitis B should be screened for this virus. You are considered at high risk for hepatitis B if:  -You were born in a country where hepatitis B occurs often. Talk with your health care provider about which countries are considered high risk.  - Your parents were born in a high-risk country and you have not received a shot to protect against hepatitis B (hepatitis B vaccine).  - You have HIV or AIDS.  - You use needles to inject street drugs.  - You live with, or have sex with, someone who has Hepatitis B.  - You get hemodialysis treatment.  - You take certain medicines for conditions like cancer, organ  transplantation, and autoimmune conditions.   Hepatitis C blood testing is recommended for all people born from [birth year] through 1965 and any individual  with known risks for hepatitis C.   Practice safe sex. Use condoms and avoid high-risk sexual practices to reduce the spread of sexually transmitted infections (STIs). STIs include gonorrhea, chlamydia, syphilis, trichomonas, herpes, HPV, and human immunodeficiency virus (HIV). Herpes, HIV, and HPV are viral illnesses that have no cure. They can result in disability, cancer, and death. Sexually active women aged 25 years and younger should be checked for chlamydia. Older women with new or multiple partners should also be tested for chlamydia. Testing for other STIs is recommended if you are sexually active and at increased risk.   Osteoporosis is a disease in which the bones lose minerals and strength with aging. This can result in serious bone fractures or breaks. The risk of osteoporosis can be identified using a bone density scan. Women ages 65 years and over and women at risk for fractures or osteoporosis should discuss screening with their health care providers. Ask your health care provider whether you should take a calcium supplement or vitamin D to There are also several preventive steps women can take to avoid osteoporosis and resulting fractures or to keep osteoporosis from worsening. -->Recommendations include:  Eat a balanced diet high in fruits, vegetables, calcium, and vitamins.  Get enough calcium. The recommended total intake of is 1,200 mg daily; for best absorption, if taking supplements, divide doses into 250-500 mg doses throughout the day. Of the two types of calcium, calcium carbonate is best absorbed when taken with food but calcium citrate can be taken on an empty stomach.  Get enough vitamin D. NAMS and the National Osteoporosis Foundation recommend at least 1,000 IU per day for women age 50 and over who are at risk of vitamin D  deficiency. Vitamin D deficiency can be caused by inadequate sun exposure (for example, those who live in northern latitudes).  Avoid alcohol and smoking. Heavy alcohol intake (more than 7 drinks per week) increases the risk of falls and hip fracture and women smokers tend to lose bone more rapidly and have lower bone mass than nonsmokers. Stopping smoking is one of the most important changes women can make to improve their health and decrease risk for disease.  Be physically active every day. Weight-bearing exercise (for example, fast walking, hiking, jogging, and weight training) may strengthen bones or slow the rate of bone loss that comes with aging. Balancing and muscle-strengthening exercises can reduce the risk of falling and fracture.  Consider therapeutic medications. Currently, several types of effective drugs are available. Healthcare providers can recommend the type most appropriate for each woman.  Eliminate environmental factors that may contribute to accidents. Falls cause nearly 90% of all osteoporotic fractures, so reducing this risk is an important bone-health strategy. Measures include ample lighting, removing obstructions to walking, using nonskid rugs on floors, and placing mats and/or grab bars in showers.  Be aware of medication side effects. Some common medicines make bones weaker. These include a type of steroid drug called glucocorticoids used for arthritis and asthma, some antiseizure drugs, certain sleeping pills, treatments for endometriosis, and some cancer drugs. An overactive thyroid gland or using too much thyroid hormone for an underactive thyroid can also be a problem. If you are taking these medicines, talk to your doctor about what you can do to help protect your bones.reduce the rate of osteoporosis.    Menopause can be associated with physical symptoms and risks. Hormone replacement therapy is available to decrease symptoms and risks. You should talk to your  health care provider   about whether hormone replacement therapy is right for you.   Use sunscreen. Apply sunscreen liberally and repeatedly throughout the day. You should seek shade when your shadow is shorter than you. Protect yourself by wearing long sleeves, pants, a wide-brimmed hat, and sunglasses year round, whenever you are outdoors.   Once a month, do a whole body skin exam, using a mirror to look at the skin on your back. Tell your health care provider of new moles, moles that have irregular borders, moles that are larger than a pencil eraser, or moles that have changed in shape or color.   -Stay current with required vaccines (immunizations).   Influenza vaccine. All adults should be immunized every year.  Tetanus, diphtheria, and acellular pertussis (Td, Tdap) vaccine. Pregnant women should receive 1 dose of Tdap vaccine during each pregnancy. The dose should be obtained regardless of the length of time since the last dose. Immunization is preferred during the 27th 36th week of gestation. An adult who has not previously received Tdap or who does not know her vaccine status should receive 1 dose of Tdap. This initial dose should be followed by tetanus and diphtheria toxoids (Td) booster doses every 10 years. Adults with an unknown or incomplete history of completing a 3-dose immunization series with Td-containing vaccines should begin or complete a primary immunization series including a Tdap dose. Adults should receive a Td booster every 10 years.  Varicella vaccine. An adult without evidence of immunity to varicella should receive 2 doses or a second dose if she has previously received 1 dose. Pregnant females who do not have evidence of immunity should receive the first dose after pregnancy. This first dose should be obtained before leaving the health care facility. The second dose should be obtained 4 8 weeks after the first dose.  Human papillomavirus (HPV) vaccine. Females aged 13 26  years who have not received the vaccine previously should obtain the 3-dose series. The vaccine is not recommended for use in pregnant females. However, pregnancy testing is not needed before receiving a dose. If a female is found to be pregnant after receiving a dose, no treatment is needed. In that case, the remaining doses should be delayed until after the pregnancy. Immunization is recommended for any person with an immunocompromised condition through the age of 26 years if she did not get any or all doses earlier. During the 3-dose series, the second dose should be obtained 4 8 weeks after the first dose. The third dose should be obtained 24 weeks after the first dose and 16 weeks after the second dose.  Zoster vaccine. One dose is recommended for adults aged 60 years or older unless certain conditions are present.  Measles, mumps, and rubella (MMR) vaccine. Adults born before 1957 generally are considered immune to measles and mumps. Adults born in 1957 or later should have 1 or more doses of MMR vaccine unless there is a contraindication to the vaccine or there is laboratory evidence of immunity to each of the three diseases. A routine second dose of MMR vaccine should be obtained at least 28 days after the first dose for students attending postsecondary schools, health care workers, or international travelers. People who received inactivated measles vaccine or an unknown type of measles vaccine during 1963 1967 should receive 2 doses of MMR vaccine. People who received inactivated mumps vaccine or an unknown type of mumps vaccine before 1979 and are at high risk for mumps infection should consider immunization with 2 doses of   MMR vaccine. For females of childbearing age, rubella immunity should be determined. If there is no evidence of immunity, females who are not pregnant should be vaccinated. If there is no evidence of immunity, females who are pregnant should delay immunization until after pregnancy.  Unvaccinated health care workers born before [birth year] who lack laboratory evidence of measles, mumps, or rubella immunity or laboratory confirmation of disease should consider measles and mumps immunization with 2 doses of MMR vaccine or rubella immunization with 1 dose of MMR vaccine.  Pneumococcal 13-valent conjugate (PCV13) vaccine. When indicated, a person who is uncertain of her immunization history and has no record of immunization should receive the PCV13 vaccine. An adult aged 54 years or older who has certain medical conditions and has not been previously immunized should receive 1 dose of PCV13 vaccine. This PCV13 should be followed with a dose of pneumococcal polysaccharide (PPSV23) vaccine. The PPSV23 vaccine dose should be obtained at least 8 weeks after the dose of PCV13 vaccine. An adult aged 58 years or older who has certain medical conditions and previously received 1 or more doses of PPSV23 vaccine should receive 1 dose of PCV13. The PCV13 vaccine dose should be obtained 1 or more years after the last PPSV23 vaccine dose.  Pneumococcal polysaccharide (PPSV23) vaccine. When PCV13 is also indicated, PCV13 should be obtained first. All adults aged 58 years and older should be immunized. An adult younger than age 65 years who has certain medical conditions should be immunized. Any person who resides in a nursing home or long-term care facility should be immunized. An adult smoker should be immunized. People with an immunocompromised condition and certain other conditions should receive both PCV13 and PPSV23 vaccines. People with human immunodeficiency virus (HIV) infection should be immunized as soon as possible after diagnosis. Immunization during chemotherapy or radiation therapy should be avoided. Routine use of PPSV23 vaccine is not recommended for American Indians, [place] Natives, or people younger than 65 years unless there are medical conditions that require PPSV23 vaccine. When indicated,  people who have unknown immunization and have no record of immunization should receive PPSV23 vaccine. One-time revaccination 5 years after the first dose of PPSV23 is recommended for people aged 70 64 years who have chronic kidney failure, nephrotic syndrome, asplenia, or immunocompromised conditions. People who received 1 2 doses of PPSV23 before age 32 years should receive another dose of PPSV23 vaccine at age 96 years or later if at least 5 years have passed since the previous dose. Doses of PPSV23 are not needed for people immunized with PPSV23 at or after age 55 years.  Meningococcal vaccine. Adults with asplenia or persistent complement component deficiencies should receive 2 doses of quadrivalent meningococcal conjugate (MenACWY-D) vaccine. The doses should be obtained at least 2 months apart. Microbiologists working with certain meningococcal bacteria, [employer] recruits, people at risk during an outbreak, and people who travel to or live in countries with a high rate of meningitis should be immunized. A first-year college student up through age 58 years who is living in a residence hall should receive a dose if she did not receive a dose on or after her 16th birthday. Adults who have certain high-risk conditions should receive one or more doses of vaccine.  Hepatitis A vaccine. Adults who wish to be protected from this disease, have certain high-risk conditions, work with hepatitis A-infected animals, work in hepatitis A research labs, or travel to or work in countries with a high rate of hepatitis A should be  immunized. Adults who were previously unvaccinated and who anticipate close contact with an international adoptee during the first 60 days after arrival in the [place] from a country with a high rate of hepatitis A should be immunized.  Hepatitis B vaccine.  Adults who wish to be protected from this disease, have certain high-risk conditions, may be exposed to blood or other infectious  body fluids, are household contacts or sex partners of hepatitis B positive people, are clients or workers in certain care facilities, or travel to or work in countries with a high rate of hepatitis B should be immunized.  Haemophilus influenzae type b (Hib) vaccine. A previously unvaccinated person with asplenia or sickle cell disease or having a scheduled splenectomy should receive 1 dose of Hib vaccine. Regardless of previous immunization, a recipient of a hematopoietic stem cell transplant should receive a 3-dose series 6 12 months after her successful transplant. Hib vaccine is not recommended for adults with HIV infection.  Preventive Services / Frequency Ages 6 to 39years  Blood pressure check.** / Every 1 to 2 years.  Lipid and cholesterol check.** / Every 5 years beginning at age 39.  Clinical breast exam.** / Every 3 years for women in their 61s and 62s.  BRCA-related cancer risk assessment.** / For women who have family members with a BRCA-related cancer (breast, ovarian, tubal, or peritoneal cancers).  Pap test.** / Every 2 years from ages 47 through 85. Every 3 years starting at age 34 through age 12 or 74 with a history of 3 consecutive normal Pap tests.  HPV screening.** / Every 3 years from ages 46 through ages 43 to 54 with a history of 3 consecutive normal Pap tests.  Hepatitis C blood test.** / For any individual with known risks for hepatitis C.  Skin self-exam. / Monthly.  Influenza vaccine. / Every year.  Tetanus, diphtheria, and acellular pertussis (Tdap, Td) vaccine.** / Consult your health care provider. Pregnant women should receive 1 dose of Tdap vaccine during each pregnancy. 1 dose of Td every 10 years.  Varicella vaccine.** / Consult your health care provider. Pregnant females who do not have evidence of immunity should receive the first dose after pregnancy.  HPV vaccine. / 3 doses over 6 months, if 64 and younger. The vaccine is not recommended for use in  pregnant females. However, pregnancy testing is not needed before receiving a dose.  Measles, mumps, rubella (MMR) vaccine.** / You need at least 1 dose of MMR if you were born in 1957 or later. You may also need a 2nd dose. For females of childbearing age, rubella immunity should be determined. If there is no evidence of immunity, females who are not pregnant should be vaccinated. If there is no evidence of immunity, females who are pregnant should delay immunization until after pregnancy.  Pneumococcal 13-valent conjugate (PCV13) vaccine.** / Consult your health care provider.  Pneumococcal polysaccharide (PPSV23) vaccine.** / 1 to 2 doses if you smoke cigarettes or if you have certain conditions.  Meningococcal vaccine.** / 1 dose if you are age 71 to 38 years and a Market researcher living in a residence hall, or have one of several medical conditions, you need to get vaccinated against meningococcal disease. You may also need additional booster doses.  Hepatitis A vaccine.** / Consult your health care provider.  Hepatitis B vaccine.** / Consult your health care provider.  Haemophilus influenzae type b (Hib) vaccine.** / Consult your health care provider.  Ages 55 to 64years  Blood pressure check.** / Every 1 to 2 years.  Lipid and cholesterol check.** / Every 5 years beginning at age 20 years.  Lung cancer screening. / Every year if you are aged 55 80 years and have a 30-pack-year history of smoking and currently smoke or have quit within the past 15 years. Yearly screening is stopped once you have quit smoking for at least 15 years or develop a health problem that would prevent you from having lung cancer treatment.  Clinical breast exam.** / Every year after age 40 years.  BRCA-related cancer risk assessment.** / For women who have family members with a BRCA-related cancer (breast, ovarian, tubal, or peritoneal cancers).  Mammogram.** / Every year beginning at age 40  years and continuing for as long as you are in good health. Consult with your health care provider.  Pap test.** / Every 3 years starting at age 30 years through age 65 or 70 years with a history of 3 consecutive normal Pap tests.  HPV screening.** / Every 3 years from ages 30 years through ages 65 to 70 years with a history of 3 consecutive normal Pap tests.  Fecal occult blood test (FOBT) of stool. / Every year beginning at age 50 years and continuing until age 75 years. You may not need to do this test if you get a colonoscopy every 10 years.  Flexible sigmoidoscopy or colonoscopy.** / Every 5 years for a flexible sigmoidoscopy or every 10 years for a colonoscopy beginning at age 50 years and continuing until age 75 years.  Hepatitis C blood test.** / For all people born from 1945 through 1965 and any individual with known risks for hepatitis C.  Skin self-exam. / Monthly.  Influenza vaccine. / Every year.  Tetanus, diphtheria, and acellular pertussis (Tdap/Td) vaccine.** / Consult your health care provider. Pregnant women should receive 1 dose of Tdap vaccine during each pregnancy. 1 dose of Td every 10 years.  Varicella vaccine.** / Consult your health care provider. Pregnant females who do not have evidence of immunity should receive the first dose after pregnancy.  Zoster vaccine.** / 1 dose for adults aged 60 years or older.  Measles, mumps, rubella (MMR) vaccine.** / You need at least 1 dose of MMR if you were born in 1957 or later. You may also need a 2nd dose. For females of childbearing age, rubella immunity should be determined. If there is no evidence of immunity, females who are not pregnant should be vaccinated. If there is no evidence of immunity, females who are pregnant should delay immunization until after pregnancy.  Pneumococcal 13-valent conjugate (PCV13) vaccine.** / Consult your health care provider.  Pneumococcal polysaccharide (PPSV23) vaccine.** / 1 to 2 doses if  you smoke cigarettes or if you have certain conditions.  Meningococcal vaccine.** / Consult your health care provider.  Hepatitis A vaccine.** / Consult your health care provider.  Hepatitis B vaccine.** / Consult your health care provider.  Haemophilus influenzae type b (Hib) vaccine.** / Consult your health care provider.  Ages 65 years and over  Blood pressure check.** / Every 1 to 2 years.  Lipid and cholesterol check.** / Every 5 years beginning at age 20 years.  Lung cancer screening. / Every year if you are aged 55 80 years and have a 30-pack-year history of smoking and currently smoke or have quit within the past 15 years. Yearly screening is stopped once you have quit smoking for at least 15 years or develop a health problem that   would prevent you from having lung cancer treatment.  Clinical breast exam.** / Every year after age 103 years.  BRCA-related cancer risk assessment.** / For women who have family members with a BRCA-related cancer (breast, ovarian, tubal, or peritoneal cancers).  Mammogram.** / Every year beginning at age 36 years and continuing for as long as you are in good health. Consult with your health care provider.  Pap test.** / Every 3 years starting at age 5 years through age 85 or 10 years with 3 consecutive normal Pap tests. Testing can be stopped between 65 and 70 years with 3 consecutive normal Pap tests and no abnormal Pap or HPV tests in the past 10 years.  HPV screening.** / Every 3 years from ages 93 years through ages 70 or 45 years with a history of 3 consecutive normal Pap tests. Testing can be stopped between 65 and 70 years with 3 consecutive normal Pap tests and no abnormal Pap or HPV tests in the past 10 years.  Fecal occult blood test (FOBT) of stool. / Every year beginning at age 8 years and continuing until age 45 years. You may not need to do this test if you get a colonoscopy every 10 years.  Flexible sigmoidoscopy or colonoscopy.** /  Every 5 years for a flexible sigmoidoscopy or every 10 years for a colonoscopy beginning at age 69 years and continuing until age 68 years.  Hepatitis C blood test.** / For all people born from [birth year] through 1964 and any individual with known risks for hepatitis C.  Osteoporosis screening.** / A one-time screening for women ages 7 years and over and women at risk for fractures or osteoporosis.  Skin self-exam. / Monthly.  Influenza vaccine. / Every year.  Tetanus, diphtheria, and acellular pertussis (Tdap/Td) vaccine.** / 1 dose of Td every 10 years.  Varicella vaccine.** / Consult your health care provider.  Zoster vaccine.** / 1 dose for adults aged 5 years or older.  Pneumococcal 13-valent conjugate (PCV13) vaccine.** / Consult your health care provider.  Pneumococcal polysaccharide (PPSV23) vaccine.** / 1 dose for all adults aged 74 years and older.  Meningococcal vaccine.** / Consult your health care provider.  Hepatitis A vaccine.** / Consult your health care provider.  Hepatitis B vaccine.** / Consult your health care provider.  Haemophilus influenzae type b (Hib) vaccine.** / Consult your health care provider. ** Family history and personal history of risk and conditions may change your health care provider's recommendations. Document Released: 04/21/2001 Document Revised: 12/14/2012  [facility] Patient Information 2014 [place], [place].   EXERCISE AND DIET:  We recommended that you start or continue a regular exercise program for good health. Regular exercise means any activity that makes your heart beat faster and makes you sweat.  We recommend exercising at least 30 minutes per day at least 3 days a week, preferably 5.  We also recommend a diet low in fat and sugar / carbohydrates.  Inactivity, poor dietary choices and obesity can cause diabetes, heart attack, stroke, and kidney damage, among others.     ALCOHOL AND SMOKING:  Women should limit their alcohol intake to no  more than 7 drinks/beers/glasses of wine (combined, not each!) per week. Moderation of alcohol intake to this level decreases your risk of breast cancer and liver damage.  ( And of course, no recreational drugs are part of a healthy lifestyle.)  Also, you should not be smoking at all or even being exposed to second hand smoke. Most people know smoking can  cause cancer, and various heart and lung diseases, but did you know it also contributes to weakening of your bones?  Aging of your skin?  Yellowing of your teeth and nails?   CALCIUM AND VITAMIN D:  Adequate intake of calcium and Vitamin D are recommended.  The recommendations for exact amounts of these supplements seem to change often, but generally speaking 600 mg of calcium (either carbonate or citrate) and 800 units of Vitamin D per day seems prudent. Certain women may benefit from higher intake of Vitamin D.  If you are among these women, your doctor will have told you during your visit.     PAP SMEARS:  Pap smears, to check for cervical cancer or precancers,  have traditionally been done yearly, although recent scientific advances have shown that most women can have pap smears less often.  However, every woman still should have a physical exam from her gynecologist or primary care physician every year. It will include a breast check, inspection of the vulva and vagina to check for abnormal growths or skin changes, a visual exam of the cervix, and then an exam to evaluate the size and shape of the uterus and ovaries.  And after 60 years of age, a rectal exam is indicated to check for rectal cancers. We will also provide age appropriate advice regarding health maintenance, like when you should have certain vaccines, screening for sexually transmitted diseases, bone density testing, colonoscopy, mammograms, etc.    MAMMOGRAMS:  All women over 71 years old should have a yearly mammogram. Many facilities now offer a "3D" mammogram, which may cost  around $50 extra out of pocket. If possible,  we recommend you accept the option to have the 3D mammogram performed.  It both reduces the number of women who will be called back for extra views which then turn out to be normal, and it is better than the routine mammogram at detecting truly abnormal areas.     COLONOSCOPY:  Colonoscopy to screen for colon cancer is recommended for all women at age 52.  We know, you hate the idea of the prep.  We agree, BUT, having colon cancer and not knowing it is worse!!  Colon cancer so often starts as a polyp that can be seen and removed at colonscopy, which can quite literally save your life!  And if your first colonoscopy is normal and you have no family history of colon cancer, most women don't have to have it again for 10 years.  Once every ten years, you can do something that may end up saving your life, right?  We will be happy to help you get it scheduled when you are ready.  Be sure to check your insurance coverage so you understand how much it will cost.  It may be covered as a preventative service at no cost, but you should check your particular policy.

## 2016-12-31 NOTE — Progress Notes (Signed)
Impression and Recommendations:    1. Encounter for wellness examination   2. Health education/counseling   3. Screening for multiple conditions   4. Vitamin D deficiency     Please see orders section below for further details of actions taken during this office visit.  Gross side effects, risk and benefits, and alternatives of medications discussed with patient.  Patient is aware that all medications have potential side effects and we are unable to predict every side effect or drug-drug interaction that may occur.  Expresses verbal understanding and consents to current therapy plan and treatment regiment.  1) Anticipatory Guidance: Discussed importance of wearing a seatbelt while driving, not texting while driving; sunscreen when outside along with yearly skin surveillance; eating a well balanced and modest diet; physical activity at least 25 minutes per day or 150 min/ week of moderate to intense activity.  2) Immunizations / Screenings / Labs:   All immunizations and screenings that patient agrees to, are up-to-date per recommendations or will be updated today.  Patient understands the needs for q 18mo dental and yearly vision screens which pt will schedule independently. Obtain CBC, CMP, HgA1c, Lipid panel, TSH and vit D when fasting if not already done recently.   3) Weight:   Discussed goal of losing even 5-10% of current body weight which would improve overall feelings of well being and improve objective health data significantly.   Improve nutrient density of diet through increasing intake of fruits and vegetables and decreasing saturated/trans fats, white flour products and refined sugar products.   F-up preventative CPE in 1 year. F/up sooner for chronic care management as discussed and/or prn.   No problem-specific Assessment & Plan notes found for this encounter.    No orders of the defined types were placed in this encounter.    Meds ordered this encounter   Medications   Ascorbic Acid (VITAMIN C) 1000 MG tablet    Sig: Take 1,000 mg by mouth daily.     Discontinued Medications   No medications on file      Please see orders placed and AVS handed out to patient at the end of our visit for further patient instructions/ counseling done pertaining to today's office visit.     Subjective:    Chief Complaint  Patient presents with   Annual Exam   CC:   HPI: Alisha Valencia is a 61 y.o. female who presents to [facility] today a yearly health maintenance exam.  Health Maintenance Summary Reviewed and updated, unless pt declines services.  Health Maintenance Summary Reviewed and updated, unless pt declines services. Alcohol:        No concerns, no excessive use STD concerns:     None, not sex active since 2006. No desire to be, no sign other Drug Use:     None; never smoked Birth control method:  Not needed Lumps or breast concerns:      no No concerning skin lesions:  Goes for yrly with her Derm- Dr Emily Filbert  Aspirin: administering 81 mg daily Colonoscopy:     (Unnecessary secondary to < 33 or > 69 years old.) Tdap: Up to date: needs TD  Pneumovax/PPSV23:  Up to date: see Immunizations. Prevnar 13/PCV13:    UTD: needs  Zostavax:    Postponed. Tobacco History Reviewed:   Y  CT scan for screening lung CA:    Abdominal Ultrasound:     (  Unnecessary secondary to < 48 or > 28 years old) Alcohol:    No concerns, no excessive use Exercise Habits:   Not meeting AHA guidelines STD concerns:   none Drug Use:   None Birth control method:   n/a Menses regular:     N/a;  Last PAP-  Lumps or breast concerns:      No;  Mammo- every Dec Breast Cancer Family History:      No Bone/ DEXA scan:      Health Maintenance  Topic Date Due   INFLUENZA VACCINE  10/07/2016   HIV Screening  12/31/2017 (Originally 06/19/1970)   MAMMOGRAM  01/27/2017   COLONOSCOPY  09/04/2017   PAP SMEAR  01/27/2018   TETANUS/TDAP   02/13/2020   Hepatitis C Screening  Completed     Wt Readings from Last 3 Encounters:  12/31/16 143 lb 12.8 oz (65.2 kg)  09/20/15 137 lb 8 oz (62.4 kg)  09/03/15 139 lb 3.2 oz (63.1 kg)   BP Readings from Last 3 Encounters:  12/31/16 125/81  09/20/15 114/72  09/03/15 133/78   Pulse Readings from Last 3 Encounters:  12/31/16 66  09/20/15 71  09/03/15 71     Past Medical History:  Diagnosis Date   Chest pain    Hypertension    Palpitations    SVT (supraventricular tachycardia) (HCC)       Past Surgical History:  Procedure Laterality Date   APPENDECTOMY     DILATATION & CURRETTAGE/HYSTEROSCOPY WITH RESECTOCOPE     x2   Nuclear Stress Test  06/10/2012   Normal   TONSILLECTOMY     TUBAL LIGATION        Family History  Problem Relation Age of Onset   Heart failure Father    Heart attack Father    Diabetes Sister       History  Drug Use No  ,   History  Alcohol Use No  ,   History  Smoking Status   Never Smoker  Smokeless Tobacco   Never Used  ,   History  Sexual Activity   Sexual activity: Not on file    Current Outpatient Prescriptions on File Prior to Visit  Medication Sig Dispense Refill   Cholecalciferol (VITAMIN D3) 5000 units TABS 5,000 IU OTC vitamin D3 daily. 90 tablet 3   omeprazole (PRILOSEC) 40 MG capsule Take one tab twice daily 2 wks, then one tab daily 60 capsule 1   No current facility-administered medications on file prior to visit.     Allergies: Erythromycin  Review of Systems: General:   Denies fever, chills, unexplained weight loss.  Optho/Auditory:   Denies visual changes, blurred vision/LOV Respiratory:   Denies SOB, DOE more than baseline levels.   Cardiovascular:   Denies chest pain, palpitations, new onset peripheral edema  Gastrointestinal:   Denies nausea, vomiting, diarrhea.  Genitourinary: Denies dysuria, freq/ urgency, flank pain or discharge from genitals.  Endocrine:     Denies hot or cold intolerance,  polyuria, polydipsia. Musculoskeletal:   Denies unexplained myalgias, joint swelling, unexplained arthralgias, gait problems.  Skin:  Denies rash, suspicious lesions Neurological:     Denies dizziness, unexplained weakness, numbness  Psychiatric/Behavioral:   Denies mood changes, suicidal or homicidal ideations, hallucinations    Objective:    Blood pressure 125/81, pulse 66, height 5' 5.5" (1.664 m), weight 143 lb 12.8 oz (65.2 kg). Body mass index is 23.57 kg/m. General Appearance:    Alert, cooperative, no distress, appears stated age  Head:    Normocephalic, without obvious abnormality, atraumatic  Eyes:    PERRL, conjunctiva/corneas clear, EOM's intact, fundi    benign, both eyes  Ears:    Normal TM's and external ear canals, both ears  Nose:   Nares normal, septum midline, mucosa normal, no drainage    or sinus tenderness  Throat:   Lips w/o lesion, mucosa moist, and tongue normal; teeth and   gums normal  Neck:   Supple, symmetrical, trachea midline, no adenopathy;    thyroid:  no enlargement/tenderness/nodules; no carotid   bruit or JVD  Back:     Symmetric, no curvature, ROM normal, no CVA tenderness  Lungs:     Clear to auscultation bilaterally, respirations unlabored, no       Wh/ R/ R  Chest Wall:    No tenderness or gross deformity; normal excursion   Heart:    Regular rate and rhythm, S1 and S2 normal, no murmur, rub   or gallop  Breast Exam:    No tenderness, masses, or nipple abnormality b/l; no d/c  Abdomen:     Soft, non-tender, bowel sounds active all four quadrants, NO   G/R/R, no masses, no organomegaly  Genitalia:  deferred  Rectal:    Normal tone, no masses or tenderness;   guaiac negative stool- negative  Extremities:   Extremities normal, atraumatic, no cyanosis or gross edema  Pulses:   2+ and symmetric all extremities  Skin:   Warm, dry, Skin color, texture, turgor normal, no obvious rashes or lesions Psych: No HI/SI, judgement and insight good,  Euthymic mood. Full Affect.  Neurologic:   CNII-XII intact, normal strength, sensation and reflexes    Throughout

## 2017-02-19 DIAGNOSIS — Z1231 Encounter for screening mammogram for malignant neoplasm of breast: Secondary | ICD-10-CM | POA: Diagnosis not present

## 2017-03-16 DIAGNOSIS — Z6822 Body mass index (BMI) 22.0-22.9, adult: Secondary | ICD-10-CM | POA: Diagnosis not present

## 2017-03-16 DIAGNOSIS — Z01419 Encounter for gynecological examination (general) (routine) without abnormal findings: Secondary | ICD-10-CM | POA: Diagnosis not present

## 2017-03-23 DIAGNOSIS — L570 Actinic keratosis: Secondary | ICD-10-CM | POA: Diagnosis not present

## 2017-04-01 DIAGNOSIS — D34 Benign neoplasm of thyroid gland: Secondary | ICD-10-CM | POA: Insufficient documentation

## 2017-04-01 DIAGNOSIS — E041 Nontoxic single thyroid nodule: Secondary | ICD-10-CM | POA: Diagnosis not present

## 2017-04-02 ENCOUNTER — Other Ambulatory Visit: Payer: Self-pay | Admitting: Otolaryngology

## 2017-04-02 DIAGNOSIS — D34 Benign neoplasm of thyroid gland: Secondary | ICD-10-CM

## 2017-04-06 DIAGNOSIS — Z1211 Encounter for screening for malignant neoplasm of colon: Secondary | ICD-10-CM | POA: Diagnosis not present

## 2017-04-09 ENCOUNTER — Ambulatory Visit
Admission: RE | Admit: 2017-04-09 | Discharge: 2017-04-09 | Disposition: A | Discharge status: Home / Self Care | Payer: BLUE CROSS/BLUE SHIELD | Source: Ambulatory Visit | Attending: Otolaryngology | Admitting: Otolaryngology

## 2017-04-09 DIAGNOSIS — D34 Benign neoplasm of thyroid gland: Secondary | ICD-10-CM

## 2017-04-09 DIAGNOSIS — E042 Nontoxic multinodular goiter: Secondary | ICD-10-CM | POA: Diagnosis not present

## 2017-04-26 ENCOUNTER — Encounter: Payer: Self-pay | Admitting: Family Medicine

## 2017-04-26 ENCOUNTER — Ambulatory Visit: Payer: BLUE CROSS/BLUE SHIELD | Admitting: Family Medicine

## 2017-04-26 VITALS — BP 123/74 | HR 60 | Ht 65.5 in | Wt 133.0 lb

## 2017-04-26 DIAGNOSIS — K1120 Sialoadenitis, unspecified: Secondary | ICD-10-CM

## 2017-04-26 NOTE — Progress Notes (Signed)
Pt here for an acute care OV today   Impression and Recommendations:    1. L Parotiditis- presumed since PE WNL     1. Left-sided Face Swelling (Behind Ear / Along Jaw) - After examination, her periodic swelling likely indicates there is a blockage in her salivary gland, esp since sx brought on by food and swelling goes down after.    - Reassured pt; Exam is within normal limits with no abn appreciated,  - pt educated re: dx process and prognosis, etc - Recommended that the patient suck on items that produce excessive salivation, such as sugar free lemon drops, lemon juice, sour patch kids - things that make you pucker or produce a lot of saliva.  Drink adequate water intake -  which pt admits not to doing.   - If it continues, with above treatment, or it gets red, hot, swollen, she should return for further attention as if may become infected, not just blocked as it is now.  - If patient desires, she may follow up with ENT.    Education and routine counseling performed. Handouts provided  Gross side effects, risk and benefits, and alternatives of medications and treatment plan in general discussed with patient.  Patient is aware that all medications have potential side effects and we are unable to predict every side effect or drug-drug interaction that may occur.   Patient will call with any questions prior to using medication if they have concerns.  Expresses verbal understanding and consents to current therapy and treatment regimen.  No barriers to understanding were identified.  Red flag symptoms and signs discussed in detail.  Patient expressed understanding regarding what to do in case of emergency\urgent symptoms   Please see AVS handed out to patient at the end of our visit for further patient instructions/ counseling done pertaining to today's office visit.   Return if symptoms worsen or fail to improve, for also f/up chronic care as previously discussed.     Note: This  document was prepared occasionally using Dragon voice recognition software and may include unintentional dictation errors in addition to a scribe.  This document serves as a record of services personally performed by Mellody Dance, DO. It was created on her behalf by Toni Amend, a trained medical scribe. The creation of this record is based on the scribe's personal observations and the provider's statements to them.   I have reviewed the above medical documentation for accuracy and completeness and I concur.  Mellody Dance 04/26/17 12:44 PM   ----------------------------------------------------------------------------------------------------------------------------------------------------------------------------------------------------------------------------    Subjective:    CC:  Chief Complaint  Patient presents with  . Facial Swelling    patient c/o swelling in right gland below the jaw pain radiated down neck and into the ear x 3 days     HPI: Alisha Valencia is a 62 y.o. female who presents to [place] [facility] today for issues as discussed below.  Left-sided Face Swelling (Behind Ear / Along Jaw) She is here today because she has an area on the left side of her face/jaw that keeps swelling. This started on Friday night, 3 days ago.  Notes that it occurs after eating.  At onset 3 days ago, she ate a piece of cake. By the time she finished, her gland was tingling and she noticed it was swollen. Notes that the area feels tender and slightly swollen.  Notes that it only happens if she eats something - swells up "  like a golf ball."  Left side.  Does not hurt over the bone - hurts in the soft tissue behind it. Patient largely denies pain on palpation.  Denies fever or chills, teeth don't hurt.  Denies icky taste in her mouth.  Denies signs of infection.  Had a "knot in the front of her throat" in December, and by the time she got into ENT, the  knot was gone. They did ultrasound her thyroid and checked thyroid levels, and they were fine.  Problem  L Parotiditis- presumed since PE WNL     Wt Readings from Last 3 Encounters:  04/26/17 133 lb (60.3 kg)  12/31/16 143 lb 12.8 oz (65.2 kg)  09/20/15 137 lb 8 oz (62.4 kg)   BP Readings from Last 3 Encounters:  04/26/17 123/74  12/31/16 125/81  09/20/15 114/72   BMI Readings from Last 3 Encounters:  04/26/17 21.80 kg/m  12/31/16 23.57 kg/m  09/20/15 22.53 kg/m     Patient Care Team    Relationship Specialty Notifications Start End  Mellody Dance, DO PCP - General Family Medicine  08/01/15   Jari Pigg, MD Consulting Physician Dermatology  09/20/15   Servando Salina, MD Consulting Physician Obstetrics and Gynecology  12/31/16   Juanita Craver, MD Consulting Physician Gastroenterology  12/31/16   Sanda Klein, MD Consulting Physician Cardiology  12/31/16    Comment: only saw 1 time or so in 2014     Patient Active Problem List   Diagnosis Date Noted  . L Parotiditis- presumed since PE WNL 04/26/2017  . Benign neoplasm of thyroid 04/01/2017  . Vitamin D deficiency 12/31/2016  . Arthritis of both hands 09/29/2015  . h/o Hypertension 09/29/2015  . Well woman exam with routine gynecological exam 09/29/2015  . Squamous cell skin cancer 09/20/2015  . Solitary pulmonary nodule 09/03/2015  . Family history of diabetes mellitus in first degree relative 09/03/2015  . Cough 08/01/2015  . Esophageal reflux 08/01/2015    Past Medical history, Surgical history, Family history, Social history, Allergies and Medications have been entered into the medical record, reviewed and changed as needed.    Current Meds  Medication Sig  . Ascorbic Acid (VITAMIN C) 1000 MG tablet Take 1,000 mg by mouth daily.  . Cholecalciferol (VITAMIN D3) 5000 units TABS 5,000 IU OTC vitamin D3 daily.  Marland Kitchen omeprazole (PRILOSEC) 40 MG capsule Take one tab twice daily 2 wks, then one tab daily      Allergies:  Allergies  Allergen Reactions  . Erythromycin      Review of Systems: General:   Denies fever, chills, unexplained weight loss.  Optho/Auditory:   Denies visual changes, blurred vision/LOV Respiratory:   Denies wheeze, DOE more than baseline levels.  Cardiovascular:   Denies chest pain, palpitations, new onset peripheral edema  Gastrointestinal:   Denies nausea, vomiting, diarrhea, abd pain.  Genitourinary: Denies dysuria, freq/ urgency, flank pain or discharge from genitals.  Endocrine:     Denies hot or cold intolerance, polyuria, polydipsia. Musculoskeletal:   Denies unexplained myalgias, joint swelling, unexplained arthralgias, gait problems.  Skin:  Denies new onset rash, suspicious lesions Neurological:     Denies dizziness, unexplained weakness, numbness  Psychiatric/Behavioral:   Denies mood changes, suicidal or homicidal ideations, hallucinations    Objective:   Blood pressure 123/74, pulse 60, height 5' 5.5" (1.664 m), weight 133 lb (60.3 kg), SpO2 99 %. Body mass index is 21.8 kg/m. General:  Well Developed, well nourished, appropriate for stated age.  Neuro:  Alert and oriented,  extra-ocular muscles intact  HEENT:  Normocephalic, atraumatic, neck supple Skin:  no gross rash, warm, pink. No ABN apprec, no swelling glands, no TTP mastoid process or boney px etc.  Respiratory:  Not using accessory muscles, speaking in full sentences- unlabored. Vascular:  Ext warm, no cyanosis apprec.; cap RF less 2 sec. Psych:  No HI/SI, judgement and insight good, Euthymic mood. Full Affect. Exam is totally normal.

## 2017-04-26 NOTE — Patient Instructions (Addendum)
Please give pt handout from Internet on Sialadenitis. Thank you

## 2017-04-29 DIAGNOSIS — K1120 Sialoadenitis, unspecified: Secondary | ICD-10-CM | POA: Insufficient documentation

## 2017-04-29 DIAGNOSIS — D34 Benign neoplasm of thyroid gland: Secondary | ICD-10-CM | POA: Diagnosis not present

## 2017-07-28 ENCOUNTER — Encounter: Payer: Self-pay | Admitting: Family Medicine

## 2017-08-17 DIAGNOSIS — H43812 Vitreous degeneration, left eye: Secondary | ICD-10-CM | POA: Diagnosis not present

## 2017-09-22 DIAGNOSIS — D2272 Melanocytic nevi of left lower limb, including hip: Secondary | ICD-10-CM | POA: Diagnosis not present

## 2017-09-22 DIAGNOSIS — D225 Melanocytic nevi of trunk: Secondary | ICD-10-CM | POA: Diagnosis not present

## 2017-09-22 DIAGNOSIS — L570 Actinic keratosis: Secondary | ICD-10-CM | POA: Diagnosis not present

## 2017-09-22 DIAGNOSIS — D485 Neoplasm of uncertain behavior of skin: Secondary | ICD-10-CM | POA: Diagnosis not present

## 2017-09-22 DIAGNOSIS — Z808 Family history of malignant neoplasm of other organs or systems: Secondary | ICD-10-CM | POA: Diagnosis not present

## 2017-10-04 DIAGNOSIS — K621 Rectal polyp: Secondary | ICD-10-CM | POA: Diagnosis not present

## 2017-10-04 DIAGNOSIS — Z1211 Encounter for screening for malignant neoplasm of colon: Secondary | ICD-10-CM | POA: Diagnosis not present

## 2017-10-04 DIAGNOSIS — K6389 Other specified diseases of intestine: Secondary | ICD-10-CM | POA: Diagnosis not present

## 2017-10-04 DIAGNOSIS — D128 Benign neoplasm of rectum: Secondary | ICD-10-CM | POA: Diagnosis not present

## 2017-10-04 DIAGNOSIS — K635 Polyp of colon: Secondary | ICD-10-CM | POA: Diagnosis not present

## 2017-10-04 DIAGNOSIS — D123 Benign neoplasm of transverse colon: Secondary | ICD-10-CM | POA: Diagnosis not present

## 2017-10-04 DIAGNOSIS — K5730 Diverticulosis of large intestine without perforation or abscess without bleeding: Secondary | ICD-10-CM | POA: Diagnosis not present

## 2017-12-25 DIAGNOSIS — Z23 Encounter for immunization: Secondary | ICD-10-CM | POA: Diagnosis not present

## 2018-01-03 DIAGNOSIS — H43813 Vitreous degeneration, bilateral: Secondary | ICD-10-CM | POA: Diagnosis not present

## 2018-01-03 DIAGNOSIS — H2513 Age-related nuclear cataract, bilateral: Secondary | ICD-10-CM | POA: Diagnosis not present

## 2018-01-03 DIAGNOSIS — H524 Presbyopia: Secondary | ICD-10-CM | POA: Diagnosis not present

## 2018-02-02 ENCOUNTER — Other Ambulatory Visit: Payer: Self-pay

## 2018-02-02 DIAGNOSIS — Z833 Family history of diabetes mellitus: Secondary | ICD-10-CM

## 2018-02-02 DIAGNOSIS — E559 Vitamin D deficiency, unspecified: Secondary | ICD-10-CM

## 2018-02-02 DIAGNOSIS — D34 Benign neoplasm of thyroid gland: Secondary | ICD-10-CM

## 2018-02-02 DIAGNOSIS — Z0000 Encounter for general adult medical examination without abnormal findings: Secondary | ICD-10-CM

## 2018-02-07 ENCOUNTER — Ambulatory Visit (INDEPENDENT_AMBULATORY_CARE_PROVIDER_SITE_OTHER): Payer: BLUE CROSS/BLUE SHIELD | Admitting: Family Medicine

## 2018-02-07 ENCOUNTER — Other Ambulatory Visit (INDEPENDENT_AMBULATORY_CARE_PROVIDER_SITE_OTHER): Payer: BLUE CROSS/BLUE SHIELD

## 2018-02-07 ENCOUNTER — Encounter: Payer: Self-pay | Admitting: Family Medicine

## 2018-02-07 VITALS — BP 137/83 | HR 71 | Temp 98.0°F | Ht 65.0 in | Wt 136.0 lb

## 2018-02-07 DIAGNOSIS — C4492 Squamous cell carcinoma of skin, unspecified: Secondary | ICD-10-CM

## 2018-02-07 DIAGNOSIS — Z719 Counseling, unspecified: Secondary | ICD-10-CM | POA: Diagnosis not present

## 2018-02-07 DIAGNOSIS — E559 Vitamin D deficiency, unspecified: Secondary | ICD-10-CM | POA: Diagnosis not present

## 2018-02-07 DIAGNOSIS — Z0000 Encounter for general adult medical examination without abnormal findings: Secondary | ICD-10-CM

## 2018-02-07 DIAGNOSIS — D34 Benign neoplasm of thyroid gland: Secondary | ICD-10-CM

## 2018-02-07 DIAGNOSIS — M8580 Other specified disorders of bone density and structure, unspecified site: Secondary | ICD-10-CM

## 2018-02-07 DIAGNOSIS — M810 Age-related osteoporosis without current pathological fracture: Secondary | ICD-10-CM | POA: Diagnosis not present

## 2018-02-07 DIAGNOSIS — Z833 Family history of diabetes mellitus: Secondary | ICD-10-CM

## 2018-02-07 DIAGNOSIS — Z780 Asymptomatic menopausal state: Secondary | ICD-10-CM

## 2018-02-07 NOTE — Progress Notes (Signed)
Impression and Recommendations:    1. Encounter for wellness examination   2. Health education/counseling   3. Vitamin D deficiency   4. Osteopenia after menopause   5. Squamous cell skin cancer     1) Anticipatory Guidance: Discussed importance of wearing a seatbelt while driving, not texting while driving; sunscreen when outside along with yearly skin surveillance; eating a well balanced and modest diet; physical activity at least 25 minutes per day or 150 min/ week of moderate to intense activity.  - Advised patient to use a nail file on her toenails instead of a clipper, to help prevent ingrown toenails.  2) Immunizations / Screenings / Labs:  All immunizations and screenings that patient agrees to, are up-to-date per recommendations or will be updated today.  Patient understands the needs for q 59mo dental and yearly vision screens which pt will schedule independently. Obtain CBC, CMP, HgA1c, Lipid panel, TSH and vit D when fasting if not already done recently.   - Flu shot up to date.  - Mammogram up to date, 02/19/2017, next scheduled for 02/21/2018.  Advised patient to have mammogram results sent to clinic after.  - Last colonoscopy 2019.  Encouraged patient to return for colonoscopy as recommended in 5-10 years.  Advised patient to call GI and ask whether she should return sooner or later, and whether ColoGuard is an option.  - Last DEXA was in 2017.  Due in near future for next DEXA.  - Continue to follow up with GYN and other specialists as recommended.  - Lab work drawn today.  Patient knows that she is welcome to come in and discuss results of her lab work in near future.  3) Prudent Habit/Health Counseling: Improve nutrient density of diet through increasing intake of fruits and vegetables and decreasing saturated/trans fats, white flour products and refined sugar products.   American Heart Association guidelines for healthy diet, basically Mediterranean diet, and  exercise guidelines of 30 minutes 5 days per week or more discussed in detail.  Health counseling performed.  All questions answered.  4) Lifestyle & Preventative Health Maintenance: - Advised patient to continue working toward exercising to improve overall mental, physical, and emotional health.    - Encouraged patient to engage in weight-bearing exercises to improve bone health.  - Reviewed the "spokes of the wheel" of mood and health management.  Stressed the importance of ongoing prudent habits, including regular exercise, appropriate sleep hygiene, healthful dietary habits, and prayer/meditation to relax.  - Encouraged patient to engage in daily physical activity, especially a formal exercise routine.  Recommended that the patient eventually strive for at least 150 minutes of moderate cardiovascular activity per week according to guidelines established by the [facility].   - Healthy dietary habits encouraged, including low-carb, and high amounts of lean protein in diet.   - Patient should also consume adequate amounts of water.  Gross side effects, risk and benefits, and alternatives of medications discussed with patient.  Patient is aware that all medications have potential side effects and we are unable to predict every side effect or drug-drug interaction that may occur.  Expresses verbal understanding and consents to current therapy plan and treatment regimen.  F-up preventative CPE in 1 year. F/up sooner for chronic care management as discussed and/or prn.  Please see orders placed and AVS handed out to patient at the end of our visit for further patient instructions/ counseling done pertaining to today's office visit.   This document serves as a  record of services personally performed by Mellody Dance, DO. It was created on her behalf by Toni Amend, a trained medical scribe. The creation of this record is based on the scribe's personal observations and the provider's statements  to them.   I have reviewed the above medical documentation for accuracy and completeness and I concur.  Mellody Dance, DO 02/16/2018 6:38 AM         Subjective:    Chief Complaint  Patient presents with  . Annual Exam    HPI: Alisha Valencia is a 62 y.o. female who presents to [facility] today a yearly health maintenance exam.  Health Maintenance Summary Reviewed and updated, unless pt declines services.  Colonoscopy: Last colonoscopy 2019.  Done with Dr. Collene Mares.  Repeat in 5 years. Tobacco History Reviewed:   Y; never smoker.  Alcohol:   No concerns, no excessive use Exercise Habits:   Not meeting AHA guidelines STD concerns:   None. Drug Use:   None. Birth control method:  N/a. Menses regular:   N/a. Lumps or breast concerns:  No Breast Cancer Family History:  No Bone/ DEXA scan: Last DEXA 2017.  Her mother had osteoporosis in her older age.  Female [place] up at [facility] with Dr. Garwin Brothers.  Mammogram up to date, 02/19/2017, next scheduled for 02/21/2018.  Obtains pap smears yearly.  Visual Health Last eye exam was back in the summer.  Went in June due to a Medical illustrator.  Dental Health Patient has her teeth cleaned every six months.  Dermatological Health She has a full body scan through dermatology, with Dr. Delman Cheadle.  She had a spot removed from her chest for biopsy, but largely denies complaints.  Thyroid Concerns Notes that her thyroid has been going "up and up," "it's just the TSH number that's up."  Denials Denies GI concerns, denies constipation, diarrhea.    Immunization History  Administered Date(s) Administered  . Tdap 02/12/2010    Health Maintenance  Topic Date Due  . PAP SMEAR  01/27/2018  . MAMMOGRAM  04/26/2018 (Originally 01/27/2017)  . HIV Screening  02/08/2019 (Originally 06/19/1970)  . INFLUENZA VACCINE  02/10/2019 (Originally 10/07/2017)  . TETANUS/TDAP  02/13/2020  . COLONOSCOPY   10/05/2027  . Hepatitis C Screening  Completed     Wt Readings from Last 3 Encounters:  02/07/18 136 lb (61.7 kg)  04/26/17 133 lb (60.3 kg)  12/31/16 143 lb 12.8 oz (65.2 kg)   BP Readings from Last 3 Encounters:  02/07/18 137/83  04/26/17 123/74  12/31/16 125/81   Pulse Readings from Last 3 Encounters:  02/07/18 71  04/26/17 60  12/31/16 66     Past Medical History:  Diagnosis Date  . Chest pain   . Hypertension   . Palpitations   . SVT (supraventricular tachycardia) (HCC)       Past Surgical History:  Procedure Laterality Date  . APPENDECTOMY    . DILATATION & CURRETTAGE/HYSTEROSCOPY WITH RESECTOCOPE     x2  . Nuclear Stress Test  06/10/2012   Normal  . TONSILLECTOMY    . TUBAL LIGATION        Family History  Problem Relation Age of Onset  . Heart failure Father   . Heart attack Father   . Diabetes Sister       Social History   Substance and Sexual Activity  Drug Use No  ,   Social History   Substance and Sexual Activity  Alcohol Use No  ,   Social History   Tobacco Use  Smoking Status Never Smoker  Smokeless Tobacco Never Used  ,   Social History   Substance and Sexual Activity  Sexual Activity Not on file    Current Outpatient Medications on File Prior to Visit  Medication Sig Dispense Refill  . Ascorbic Acid (VITAMIN C) 1000 MG tablet Take 1,000 mg by mouth daily.    . Cholecalciferol (VITAMIN D3) 5000 units TABS 5,000 IU OTC vitamin D3 daily. 90 tablet 3   No current facility-administered medications on file prior to visit.     Allergies: Erythromycin  Review of Systems: General:   Denies fever, chills, unexplained weight loss.  Optho/Auditory:   Denies visual changes, blurred vision/LOV Respiratory:   Denies SOB, DOE more than baseline levels.  Cardiovascular:   Denies chest pain, palpitations, new onset peripheral edema  Gastrointestinal:   Denies nausea, vomiting, diarrhea.  Genitourinary: Denies dysuria, freq/  urgency, flank pain or discharge from genitals.  Endocrine:     Denies hot or cold intolerance, polyuria, polydipsia. Musculoskeletal:   Denies unexplained myalgias, joint swelling, unexplained arthralgias, gait problems.  Skin:  Denies rash, suspicious lesions Neurological:     Denies dizziness, unexplained weakness, numbness  Psychiatric/Behavioral:   Denies mood changes, suicidal or homicidal ideations, hallucinations    Objective:    Blood pressure 137/83, pulse 71, temperature 98 F (36.7 C), height 5\' 5"  (1.651 m), weight 136 lb (61.7 kg), SpO2 100 %. Body mass index is 22.63 kg/m. General Appearance:    Alert, cooperative, no distress, appears stated age  Head:    Normocephalic, without obvious abnormality, atraumatic  Eyes:    PERRL, conjunctiva/corneas clear, EOM's intact, fundi    benign, both eyes  Ears:    Normal TM's and external ear canals, both ears  Nose:   Nares normal, septum midline, mucosa normal, no drainage    or sinus tenderness  Throat:   Lips w/o lesion, mucosa moist, and tongue normal; teeth and   gums normal  Neck:   Supple, symmetrical, trachea midline, no adenopathy;    thyroid:  no enlargement/tenderness/nodules; no carotid   bruit or JVD  Back:     Symmetric, no curvature, ROM normal, no CVA tenderness  Lungs:     Clear to auscultation bilaterally, respirations unlabored, no       Wh/ R/ R  Chest Wall:    No tenderness or gross deformity; normal excursion   Heart:    Regular rate and rhythm, S1 and S2 normal, no murmur, rub   or gallop  Breast Exam:   Deferred to annual OBGYN exam.  Abdomen:     Soft, non-tender, bowel sounds active all four quadrants, NO   G/R/R, no masses, no organomegaly  Genitalia:    Deferred to annual OBGYN exam.  Rectal:    Patient had colonoscopy this year.  Otherwise deferred to annual OBGYN exam.  Extremities:   Extremities normal, atraumatic, no cyanosis or gross edema  Pulses:   2+ and symmetric all extremities  Skin:    Warm, dry, Skin color, texture, turgor normal, no obvious rashes or lesions Psych: No HI/SI, judgement and insight good, Euthymic mood. Full Affect.  Neurologic:   CNII-XII intact, normal strength, sensation and reflexes    Throughout

## 2018-02-07 NOTE — Patient Instructions (Addendum)
Please investigate ColoGuard.  Ask GI whether you can try ColoGuard in 3-5 years and repeat colonoscopy in 10 years.  -And as discussed Alisha Valencia, I welcome you to come in to review labs with me\discuss results.   Preventive Care for Adults, Female  A healthy lifestyle and preventive care can promote health and wellness. Preventive health guidelines for women include the following key practices.   A routine yearly physical is a good way to check with your health care provider about your health and preventive screening. It is a chance to share any concerns and updates on your health and to receive a thorough exam.   Visit your dentist for a routine exam and preventive care every 6 months. Brush your teeth twice a day and floss once a day. Good oral hygiene prevents tooth decay and gum disease.   The frequency of eye exams is based on your age, health, family medical history, use of contact lenses, and other factors. Follow your health care provider's recommendations for frequency of eye exams.   Eat a healthy diet. Foods like vegetables, fruits, whole grains, low-fat dairy products, and lean protein foods contain the nutrients you need without too many calories. Decrease your intake of foods high in solid fats, added sugars, and salt. Eat the right amount of calories for you.Get information about a proper diet from your health care provider, if necessary.   Regular physical exercise is one of the most important things you can do for your health. Most adults should get at least 150 minutes of moderate-intensity exercise (any activity that increases your heart rate and causes you to sweat) each week. In addition, most adults need muscle-strengthening exercises on 2 or more days a week.   Maintain a healthy weight. The body mass index (BMI) is a screening tool to identify possible weight problems. It provides an estimate of body fat based on height and weight. Your health care provider can find  your BMI, and can help you achieve or maintain a healthy weight.For adults 20 years and older:   - A BMI below 18.5 is considered underweight.   - A BMI of 18.5 to 24.9 is normal.   - A BMI of 25 to 29.9 is considered overweight.   - A BMI of 30 and above is considered obese.   Maintain normal blood lipids and cholesterol levels by exercising and minimizing your intake of trans and saturated fats.  Eat a balanced diet with plenty of fruit and vegetables. Blood tests for lipids and cholesterol should begin at age 43 and be repeated every 5 years minimum.  If your lipid or cholesterol levels are high, you are over 40, or you are at high risk for heart disease, you may need your cholesterol levels checked more frequently.Ongoing high lipid and cholesterol levels should be treated with medicines if diet and exercise are not working.   If you smoke, find out from your health care provider how to quit. If you do not use tobacco, do not start.   Lung cancer screening is recommended for adults aged 28-80 years who are at high risk for developing lung cancer because of a history of smoking. A yearly low-dose CT scan of the lungs is recommended for people who have at least a 30-pack-year history of smoking and are a current smoker or have quit within the past 15 years. A pack year of smoking is smoking an average of 1 pack of cigarettes a day for 1 year (for example: 1  pack a day for 30 years or 2 packs a day for 15 years). Yearly screening should continue until the smoker has stopped smoking for at least 15 years. Yearly screening should be stopped for people who develop a health problem that would prevent them from having lung cancer treatment.   If you are pregnant, do not drink alcohol. If you are breastfeeding, be very cautious about drinking alcohol. If you are not pregnant and choose to drink alcohol, do not have more than 1 drink per day. One drink is considered to be 12 ounces (355 mL) of beer, 5  ounces (148 mL) of wine, or 1.5 ounces (44 mL) of liquor.   Avoid use of street drugs. Do not share needles with anyone. Ask for help if you need support or instructions about stopping the use of drugs.   High blood pressure causes heart disease and increases the risk of stroke. Your blood pressure should be checked at least yearly.  Ongoing high blood pressure should be treated with medicines if weight loss and exercise do not work.   If you are 52-68 years old, ask your health care provider if you should take aspirin to prevent strokes.   Diabetes screening involves taking a blood sample to check your fasting blood sugar level. This should be done once every 3 years, after age 90, if you are within normal weight and without risk factors for diabetes. Testing should be considered at a younger age or be carried out more frequently if you are overweight and have at least 1 risk factor for diabetes.   Breast cancer screening is essential preventive care for women. You should practice "breast self-awareness."  This means understanding the normal appearance and feel of your breasts and may include breast self-examination.  Any changes detected, no matter how small, should be reported to a health care provider.  Women in their 51s and 30s should have a clinical breast exam (CBE) by a health care provider as part of a regular health exam every 1 to 3 years.  After age 30, women should have a CBE every year.  Starting at age 32, women should consider having a mammogram (breast X-ray test) every year.  Women who have a family history of breast cancer should talk to their health care provider about genetic screening.  Women at a high risk of breast cancer should talk to their health care providers about having an MRI and a mammogram every year.   -Breast cancer gene (BRCA)-related cancer risk assessment is recommended for women who have family members with BRCA-related cancers. BRCA-related cancers include  breast, ovarian, tubal, and peritoneal cancers. Having family members with these cancers may be associated with an increased risk for harmful changes (mutations) in the breast cancer genes BRCA1 and BRCA2. Results of the assessment will determine the need for genetic counseling and BRCA1 and BRCA2 testing.   The Pap test is a screening test for cervical cancer. A Pap test can show cell changes on the cervix that might become cervical cancer if left untreated. A Pap test is a procedure in which cells are obtained and examined from the lower end of the uterus (cervix).   - Women should have a Pap test starting at age 36.   - Between ages 96 and 82, Pap tests should be repeated every 2 years.   - Beginning at age 47, you should have a Pap test every 3 years as long as the past 3 Pap tests have been  normal.   - Some women have medical problems that increase the chance of getting cervical cancer. Talk to your health care provider about these problems. It is especially important to talk to your health care provider if a new problem develops soon after your last Pap test. In these cases, your health care provider may recommend more frequent screening and Pap tests.   - The above recommendations are the same for women who have or have not gotten the vaccine for human papillomavirus (HPV).   - If you had a hysterectomy for a problem that was not cancer or a condition that could lead to cancer, then you no longer need Pap tests. Even if you no longer need a Pap test, a regular exam is a good idea to make sure no other problems are starting.   - If you are between ages 29 and 26 years, and you have had normal Pap tests going back 10 years, you no longer need Pap tests. Even if you no longer need a Pap test, a regular exam is a good idea to make sure no other problems are starting.   - If you have had past treatment for cervical cancer or a condition that could lead to cancer, you need Pap tests and screening  for cancer for at least 20 years after your treatment.   - If Pap tests have been discontinued, risk factors (such as a new sexual partner) need to be reassessed to determine if screening should be resumed.   - The HPV test is an additional test that may be used for cervical cancer screening. The HPV test looks for the virus that can cause the cell changes on the cervix. The cells collected during the Pap test can be tested for HPV. The HPV test could be used to screen women aged 55 years and older, and should be used in women of any age who have unclear Pap test results. After the age of 37, women should have HPV testing at the same frequency as a Pap test.   Colorectal cancer can be detected and often prevented. Most routine colorectal cancer screening begins at the age of 13 years and continues through age 64 years. However, your health care provider may recommend screening at an earlier age if you have risk factors for colon cancer. On a yearly basis, your health care provider may provide home test kits to check for hidden blood in the stool.  Use of a small camera at the end of a tube, to directly examine the colon (sigmoidoscopy or colonoscopy), can detect the earliest forms of colorectal cancer. Talk to your health care provider about this at age 40, when routine screening begins. Direct exam of the colon should be repeated every 5 -10 years through age 20 years, unless early forms of pre-cancerous polyps or small growths are found.   People who are at an increased risk for hepatitis B should be screened for this virus. You are considered at high risk for hepatitis B if:  -You were born in a country where hepatitis B occurs often. Talk with your health care provider about which countries are considered high risk.  - Your parents were born in a high-risk country and you have not received a shot to protect against hepatitis B (hepatitis B vaccine).  - You have HIV or AIDS.  - You use needles to  inject street drugs.  - You live with, or have sex with, someone who has Hepatitis B.  -  You get hemodialysis treatment.  - You take certain medicines for conditions like cancer, organ transplantation, and autoimmune conditions.   Hepatitis C blood testing is recommended for all people born from [birth year] through 1964 and any individual with known risks for hepatitis C.   Practice safe sex. Use condoms and avoid high-risk sexual practices to reduce the spread of sexually transmitted infections (STIs). STIs include gonorrhea, chlamydia, syphilis, trichomonas, herpes, HPV, and human immunodeficiency virus (HIV). Herpes, HIV, and HPV are viral illnesses that have no cure. They can result in disability, cancer, and death. Sexually active women aged 51 years and younger should be checked for chlamydia. Older women with new or multiple partners should also be tested for chlamydia. Testing for other STIs is recommended if you are sexually active and at increased risk.   Osteoporosis is a disease in which the bones lose minerals and strength with aging. This can result in serious bone fractures or breaks. The risk of osteoporosis can be identified using a bone density scan. Women ages 32 years and over and women at risk for fractures or osteoporosis should discuss screening with their health care providers. Ask your health care provider whether you should take a calcium supplement or vitamin D to There are also several preventive steps women can take to avoid osteoporosis and resulting fractures or to keep osteoporosis from worsening. -->Recommendations include:  Eat a balanced diet high in fruits, vegetables, calcium, and vitamins.  Get enough calcium. The recommended total intake of is 1,200 mg daily; for best absorption, if taking supplements, divide doses into 250-500 mg doses throughout the day. Of the two types of calcium, calcium carbonate is best absorbed when taken with food but calcium citrate can be  taken on an empty stomach.  Get enough vitamin D. [employer] and the [employer] recommend at least 1,000 IU per day for women age 8 and over who are at risk of vitamin D deficiency. Vitamin D deficiency can be caused by inadequate sun exposure (for example, those who live in [place]).  Avoid alcohol and smoking. Heavy alcohol intake (more than 7 drinks per week) increases the risk of falls and hip fracture and women smokers tend to lose bone more rapidly and have lower bone mass than nonsmokers. Stopping smoking is one of the most important changes women can make to improve their health and decrease risk for disease.  Be physically active every day. Weight-bearing exercise (for example, fast walking, hiking, jogging, and weight training) may strengthen bones or slow the rate of bone loss that comes with aging. Balancing and muscle-strengthening exercises can reduce the risk of falling and fracture.  Consider therapeutic medications. Currently, several types of effective drugs are available. Healthcare providers can recommend the type most appropriate for each woman.  Eliminate environmental factors that may contribute to accidents. Falls cause nearly 90% of all osteoporotic fractures, so reducing this risk is an important bone-health strategy. Measures include ample lighting, removing obstructions to walking, using nonskid rugs on floors, and placing mats and/or grab bars in showers.  Be aware of medication side effects. Some common medicines make bones weaker. These include a type of steroid drug called glucocorticoids used for arthritis and asthma, some antiseizure drugs, certain sleeping pills, treatments for endometriosis, and some cancer drugs. An overactive thyroid gland or using too much thyroid hormone for an underactive thyroid can also be a problem. If you are taking these medicines, talk to your doctor about what you can do to help  protect your bones.reduce the rate  of osteoporosis.    Menopause can be associated with physical symptoms and risks. Hormone replacement therapy is available to decrease symptoms and risks. You should talk to your health care provider about whether hormone replacement therapy is right for you.   Use sunscreen. Apply sunscreen liberally and repeatedly throughout the day. You should seek shade when your shadow is shorter than you. Protect yourself by wearing long sleeves, pants, a wide-brimmed hat, and sunglasses year round, whenever you are outdoors.   Once a month, do a whole body skin exam, using a mirror to look at the skin on your back. Tell your health care provider of new moles, moles that have irregular borders, moles that are larger than a pencil eraser, or moles that have changed in shape or color.   -Stay current with required vaccines (immunizations).   Influenza vaccine. All adults should be immunized every year.  Tetanus, diphtheria, and acellular pertussis (Td, Tdap) vaccine. Pregnant women should receive 1 dose of Tdap vaccine during each pregnancy. The dose should be obtained regardless of the length of time since the last dose. Immunization is preferred during the 27th 36th week of gestation. An adult who has not previously received Tdap or who does not know her vaccine status should receive 1 dose of Tdap. This initial dose should be followed by tetanus and diphtheria toxoids (Td) booster doses every 10 years. Adults with an unknown or incomplete history of completing a 3-dose immunization series with Td-containing vaccines should begin or complete a primary immunization series including a Tdap dose. Adults should receive a Td booster every 10 years.  Varicella vaccine. An adult without evidence of immunity to varicella should receive 2 doses or a second dose if she has previously received 1 dose. Pregnant females who do not have evidence of immunity should receive the first dose after pregnancy. This first dose  should be obtained before leaving the health care facility. The second dose should be obtained 4 8 weeks after the first dose.  Human papillomavirus (HPV) vaccine. Females aged 27 26 years who have not received the vaccine previously should obtain the 3-dose series. The vaccine is not recommended for use in pregnant females. However, pregnancy testing is not needed before receiving a dose. If a female is found to be pregnant after receiving a dose, no treatment is needed. In that case, the remaining doses should be delayed until after the pregnancy. Immunization is recommended for any person with an immunocompromised condition through the age of 76 years if she did not get any or all doses earlier. During the 3-dose series, the second dose should be obtained 4 8 weeks after the first dose. The third dose should be obtained 24 weeks after the first dose and 16 weeks after the second dose.  Zoster vaccine. One dose is recommended for adults aged 46 years or older unless certain conditions are present.  Measles, mumps, and rubella (MMR) vaccine. Adults born before [birth year] generally are considered immune to measles and mumps. Adults born in [birth year] or later should have 1 or more doses of MMR vaccine unless there is a contraindication to the vaccine or there is laboratory evidence of immunity to each of the three diseases. A routine second dose of MMR vaccine should be obtained at least 28 days after the first dose for students attending postsecondary schools, health care workers, or international travelers. People who received inactivated measles vaccine or an unknown type of measles vaccine during 1963  1967 should receive 2 doses of MMR vaccine. People who received inactivated mumps vaccine or an unknown type of mumps vaccine before 1979 and are at high risk for mumps infection should consider immunization with 2 doses of MMR vaccine. For females of childbearing age, rubella immunity should be determined. If there is  no evidence of immunity, females who are not pregnant should be vaccinated. If there is no evidence of immunity, females who are pregnant should delay immunization until after pregnancy. Unvaccinated health care workers born before [birth year] who lack laboratory evidence of measles, mumps, or rubella immunity or laboratory confirmation of disease should consider measles and mumps immunization with 2 doses of MMR vaccine or rubella immunization with 1 dose of MMR vaccine.  Pneumococcal 13-valent conjugate (PCV13) vaccine. When indicated, a person who is uncertain of her immunization history and has no record of immunization should receive the PCV13 vaccine. An adult aged 10 years or older who has certain medical conditions and has not been previously immunized should receive 1 dose of PCV13 vaccine. This PCV13 should be followed with a dose of pneumococcal polysaccharide (PPSV23) vaccine. The PPSV23 vaccine dose should be obtained at least 8 weeks after the dose of PCV13 vaccine. An adult aged 70 years or older who has certain medical conditions and previously received 1 or more doses of PPSV23 vaccine should receive 1 dose of PCV13. The PCV13 vaccine dose should be obtained 1 or more years after the last PPSV23 vaccine dose.  Pneumococcal polysaccharide (PPSV23) vaccine. When PCV13 is also indicated, PCV13 should be obtained first. All adults aged 47 years and older should be immunized. An adult younger than age 57 years who has certain medical conditions should be immunized. Any person who resides in a nursing home or long-term care facility should be immunized. An adult smoker should be immunized. People with an immunocompromised condition and certain other conditions should receive both PCV13 and PPSV23 vaccines. People with human immunodeficiency virus (HIV) infection should be immunized as soon as possible after diagnosis. Immunization during chemotherapy or radiation therapy should be avoided. Routine use of  PPSV23 vaccine is not recommended for American Indians, [place] Natives, or people younger than 65 years unless there are medical conditions that require PPSV23 vaccine. When indicated, people who have unknown immunization and have no record of immunization should receive PPSV23 vaccine. One-time revaccination 5 years after the first dose of PPSV23 is recommended for people aged 11 64 years who have chronic kidney failure, nephrotic syndrome, asplenia, or immunocompromised conditions. People who received 1 2 doses of PPSV23 before age 87 years should receive another dose of PPSV23 vaccine at age 49 years or later if at least 5 years have passed since the previous dose. Doses of PPSV23 are not needed for people immunized with PPSV23 at or after age 30 years.  Meningococcal vaccine. Adults with asplenia or persistent complement component deficiencies should receive 2 doses of quadrivalent meningococcal conjugate (MenACWY-D) vaccine. The doses should be obtained at least 2 months apart. Microbiologists working with certain meningococcal bacteria, [employer] recruits, people at risk during an outbreak, and people who travel to or live in countries with a high rate of meningitis should be immunized. A first-year college student up through age 75 years who is living in a residence hall should receive a dose if she did not receive a dose on or after her 16th birthday. Adults who have certain high-risk conditions should receive one or more doses of vaccine.  Hepatitis A vaccine. Adults who  wish to be protected from this disease, have certain high-risk conditions, work with hepatitis A-infected animals, work in hepatitis A research labs, or travel to or work in countries with a high rate of hepatitis A should be immunized. Adults who were previously unvaccinated and who anticipate close contact with an international adoptee during the first 60 days after arrival in the [place] from a country with a high rate of  hepatitis A should be immunized.  Hepatitis B vaccine.  Adults who wish to be protected from this disease, have certain high-risk conditions, may be exposed to blood or other infectious body fluids, are household contacts or sex partners of hepatitis B positive people, are clients or workers in certain care facilities, or travel to or work in countries with a high rate of hepatitis B should be immunized.  Haemophilus influenzae type b (Hib) vaccine. A previously unvaccinated person with asplenia or sickle cell disease or having a scheduled splenectomy should receive 1 dose of Hib vaccine. Regardless of previous immunization, a recipient of a hematopoietic stem cell transplant should receive a 3-dose series 6 12 months after her successful transplant. Hib vaccine is not recommended for adults with HIV infection.  Preventive Services / Frequency Ages 54 to 39years  Blood pressure check.** / Every 1 to 2 years.  Lipid and cholesterol check.** / Every 5 years beginning at age 92.  Clinical breast exam.** / Every 3 years for women in their 32s and 17s.  BRCA-related cancer risk assessment.** / For women who have family members with a BRCA-related cancer (breast, ovarian, tubal, or peritoneal cancers).  Pap test.** / Every 2 years from ages 76 through 22. Every 3 years starting at age 60 through age 32 or 58 with a history of 3 consecutive normal Pap tests.  HPV screening.** / Every 3 years from ages 50 through ages 67 to 61 with a history of 3 consecutive normal Pap tests.  Hepatitis C blood test.** / For any individual with known risks for hepatitis C.  Skin self-exam. / Monthly.  Influenza vaccine. / Every year.  Tetanus, diphtheria, and acellular pertussis (Tdap, Td) vaccine.** / Consult your health care provider. Pregnant women should receive 1 dose of Tdap vaccine during each pregnancy. 1 dose of Td every 10 years.  Varicella vaccine.** / Consult your health care provider. Pregnant  females who do not have evidence of immunity should receive the first dose after pregnancy.  HPV vaccine. / 3 doses over 6 months, if 47 and younger. The vaccine is not recommended for use in pregnant females. However, pregnancy testing is not needed before receiving a dose.  Measles, mumps, rubella (MMR) vaccine.** / You need at least 1 dose of MMR if you were born in 1957 or later. You may also need a 2nd dose. For females of childbearing age, rubella immunity should be determined. If there is no evidence of immunity, females who are not pregnant should be vaccinated. If there is no evidence of immunity, females who are pregnant should delay immunization until after pregnancy.  Pneumococcal 13-valent conjugate (PCV13) vaccine.** / Consult your health care provider.  Pneumococcal polysaccharide (PPSV23) vaccine.** / 1 to 2 doses if you smoke cigarettes or if you have certain conditions.  Meningococcal vaccine.** / 1 dose if you are age 57 to 80 years and a Market researcher living in a residence hall, or have one of several medical conditions, you need to get vaccinated against meningococcal disease. You may also need additional booster doses.  Hepatitis A vaccine.** / Consult your health care provider.  Hepatitis B vaccine.** / Consult your health care provider.  Haemophilus influenzae type b (Hib) vaccine.** / Consult your health care provider.  Ages 34 to 64years  Blood pressure check.** / Every 1 to 2 years.  Lipid and cholesterol check.** / Every 5 years beginning at age 39 years.  Lung cancer screening. / Every year if you are aged 75 80 years and have a 30-pack-year history of smoking and currently smoke or have quit within the past 15 years. Yearly screening is stopped once you have quit smoking for at least 15 years or develop a health problem that would prevent you from having lung cancer treatment.  Clinical breast exam.** / Every year after age 33  years.  BRCA-related cancer risk assessment.** / For women who have family members with a BRCA-related cancer (breast, ovarian, tubal, or peritoneal cancers).  Mammogram.** / Every year beginning at age 96 years and continuing for as long as you are in good health. Consult with your health care provider.  Pap test.** / Every 3 years starting at age 54 years through age 20 or 71 years with a history of 3 consecutive normal Pap tests.  HPV screening.** / Every 3 years from ages 77 years through ages 58 to 22 years with a history of 3 consecutive normal Pap tests.  Fecal occult blood test (FOBT) of stool. / Every year beginning at age 79 years and continuing until age 74 years. You may not need to do this test if you get a colonoscopy every 10 years.  Flexible sigmoidoscopy or colonoscopy.** / Every 5 years for a flexible sigmoidoscopy or every 10 years for a colonoscopy beginning at age 96 years and continuing until age 74 years.  Hepatitis C blood test.** / For all people born from [birth year] through 1964 and any individual with known risks for hepatitis C.  Skin self-exam. / Monthly.  Influenza vaccine. / Every year.  Tetanus, diphtheria, and acellular pertussis (Tdap/Td) vaccine.** / Consult your health care provider. Pregnant women should receive 1 dose of Tdap vaccine during each pregnancy. 1 dose of Td every 10 years.  Varicella vaccine.** / Consult your health care provider. Pregnant females who do not have evidence of immunity should receive the first dose after pregnancy.  Zoster vaccine.** / 1 dose for adults aged 51 years or older.  Measles, mumps, rubella (MMR) vaccine.** / You need at least 1 dose of MMR if you were born in 1957 or later. You may also need a 2nd dose. For females of childbearing age, rubella immunity should be determined. If there is no evidence of immunity, females who are not pregnant should be vaccinated. If there is no evidence of immunity, females who are  pregnant should delay immunization until after pregnancy.  Pneumococcal 13-valent conjugate (PCV13) vaccine.** / Consult your health care provider.  Pneumococcal polysaccharide (PPSV23) vaccine.** / 1 to 2 doses if you smoke cigarettes or if you have certain conditions.  Meningococcal vaccine.** / Consult your health care provider.  Hepatitis A vaccine.** / Consult your health care provider.  Hepatitis B vaccine.** / Consult your health care provider.  Haemophilus influenzae type b (Hib) vaccine.** / Consult your health care provider.  Ages 70 years and over  Blood pressure check.** / Every 1 to 2 years.  Lipid and cholesterol check.** / Every 5 years beginning at age 76 years.  Lung cancer screening. / Every year if you are aged 75 80 years  and have a 30-pack-year history of smoking and currently smoke or have quit within the past 15 years. Yearly screening is stopped once you have quit smoking for at least 15 years or develop a health problem that would prevent you from having lung cancer treatment.  Clinical breast exam.** / Every year after age 70 years.  BRCA-related cancer risk assessment.** / For women who have family members with a BRCA-related cancer (breast, ovarian, tubal, or peritoneal cancers).  Mammogram.** / Every year beginning at age 91 years and continuing for as long as you are in good health. Consult with your health care provider.  Pap test.** / Every 3 years starting at age 73 years through age 31 or 27 years with 3 consecutive normal Pap tests. Testing can be stopped between 65 and 70 years with 3 consecutive normal Pap tests and no abnormal Pap or HPV tests in the past 10 years.  HPV screening.** / Every 3 years from ages 77 years through ages 83 or 66 years with a history of 3 consecutive normal Pap tests. Testing can be stopped between 65 and 70 years with 3 consecutive normal Pap tests and no abnormal Pap or HPV tests in the past 10 years.  Fecal occult  blood test (FOBT) of stool. / Every year beginning at age 32 years and continuing until age 77 years. You may not need to do this test if you get a colonoscopy every 10 years.  Flexible sigmoidoscopy or colonoscopy.** / Every 5 years for a flexible sigmoidoscopy or every 10 years for a colonoscopy beginning at age 66 years and continuing until age 12 years.  Hepatitis C blood test.** / For all people born from [birth year] through 1964 and any individual with known risks for hepatitis C.  Osteoporosis screening.** / A one-time screening for women ages 85 years and over and women at risk for fractures or osteoporosis.  Skin self-exam. / Monthly.  Influenza vaccine. / Every year.  Tetanus, diphtheria, and acellular pertussis (Tdap/Td) vaccine.** / 1 dose of Td every 10 years.  Varicella vaccine.** / Consult your health care provider.  Zoster vaccine.** / 1 dose for adults aged 34 years or older.  Pneumococcal 13-valent conjugate (PCV13) vaccine.** / Consult your health care provider.  Pneumococcal polysaccharide (PPSV23) vaccine.** / 1 dose for all adults aged 54 years and older.  Meningococcal vaccine.** / Consult your health care provider.  Hepatitis A vaccine.** / Consult your health care provider.  Hepatitis B vaccine.** / Consult your health care provider.  Haemophilus influenzae type b (Hib) vaccine.** / Consult your health care provider. ** Family history and personal history of risk and conditions may change your health care provider's recommendations. Document Released: 04/21/2001 Document Revised: 12/14/2012  [facility] Patient Information 2014 [place], [place].   EXERCISE AND DIET:  We recommended that you start or continue a regular exercise program for good health. Regular exercise means any activity that makes your heart beat faster and makes you sweat.  We recommend exercising at least 30 minutes per day at least 3 days a week, preferably 5.  We also recommend a diet low in fat  and sugar / carbohydrates.  Inactivity, poor dietary choices and obesity can cause diabetes, heart attack, stroke, and kidney damage, among others.     ALCOHOL AND SMOKING:  Women should limit their alcohol intake to no more than 7 drinks/beers/glasses of wine (combined, not each!) per week. Moderation of alcohol intake to this level decreases your risk of breast cancer and  liver damage.  ( And of course, no recreational drugs are part of a healthy lifestyle.)  Also, you should not be smoking at all or even being exposed to second hand smoke. Most people know smoking can cause cancer, and various heart and lung diseases, but did you know it also contributes to weakening of your bones?  Aging of your skin?  Yellowing of your teeth and nails?   CALCIUM AND VITAMIN D:  Adequate intake of calcium and Vitamin D are recommended.  The recommendations for exact amounts of these supplements seem to change often, but generally speaking 600 mg of calcium (either carbonate or citrate) and 800 units of Vitamin D per day seems prudent. Certain women may benefit from higher intake of Vitamin D.  If you are among these women, your doctor will have told you during your visit.     PAP SMEARS:  Pap smears, to check for cervical cancer or precancers,  have traditionally been done yearly, although recent scientific advances have shown that most women can have pap smears less often.  However, every woman still should have a physical exam from her gynecologist or primary care physician every year. It will include a breast check, inspection of the vulva and vagina to check for abnormal growths or skin changes, a visual exam of the cervix, and then an exam to evaluate the size and shape of the uterus and ovaries.  And after 60 years of age, a rectal exam is indicated to check for rectal cancers. We will also provide age appropriate advice regarding health maintenance, like when you should have certain vaccines, screening for  sexually transmitted diseases, bone density testing, colonoscopy, mammograms, etc.    MAMMOGRAMS:  All women over 35 years old should have a yearly mammogram. Many facilities now offer a "3D" mammogram, which may cost around $50 extra out of pocket. If possible,  we recommend you accept the option to have the 3D mammogram performed.  It both reduces the number of women who will be called back for extra views which then turn out to be normal, and it is better than the routine mammogram at detecting truly abnormal areas.     COLONOSCOPY:  Colonoscopy to screen for colon cancer is recommended for all women at age 3.  We know, you hate the idea of the prep.  We agree, BUT, having colon cancer and not knowing it is worse!!  Colon cancer so often starts as a polyp that can be seen and removed at colonscopy, which can quite literally save your life!  And if your first colonoscopy is normal and you have no family history of colon cancer, most women don't have to have it again for 10 years.  Once every ten years, you can do something that may end up saving your life, right?  We will be happy to help you get it scheduled when you are ready.  Be sure to check your insurance coverage so you understand how much it will cost.  It may be covered as a preventative service at no cost, but you should check your particular policy.

## 2018-02-08 LAB — COMPREHENSIVE METABOLIC PANEL
ALK PHOS: 83 IU/L (ref 39–117)
ALT: 20 IU/L (ref 0–32)
AST: 22 IU/L (ref 0–40)
Albumin/Globulin Ratio: 1.5 (ref 1.2–2.2)
Albumin: 4.1 g/dL (ref 3.6–4.8)
BILIRUBIN TOTAL: 0.3 mg/dL (ref 0.0–1.2)
BUN/Creatinine Ratio: 23 (ref 12–28)
BUN: 16 mg/dL (ref 8–27)
CHLORIDE: 106 mmol/L (ref 96–106)
CO2: 26 mmol/L (ref 20–29)
Calcium: 9.4 mg/dL (ref 8.7–10.3)
Creatinine, Ser: 0.71 mg/dL (ref 0.57–1.00)
GFR calc Af Amer: 106 mL/min/{1.73_m2} (ref >59)
GFR calc non Af Amer: 92 mL/min/{1.73_m2} (ref >59)
GLUCOSE: 93 mg/dL (ref 65–99)
Globulin, Total: 2.8 g/dL (ref 1.5–4.5)
Potassium: 4.8 mmol/L (ref 3.5–5.2)
Sodium: 145 mmol/L — ABNORMAL HIGH (ref 134–144)
Total Protein: 6.9 g/dL (ref 6.0–8.5)

## 2018-02-08 LAB — CBC WITH DIFFERENTIAL/PLATELET
BASOS ABS: 0 10*3/uL (ref 0.0–0.2)
Basos: 1 %
EOS (ABSOLUTE): 0.1 10*3/uL (ref 0.0–0.4)
Eos: 2 %
HEMOGLOBIN: 11.6 g/dL (ref 11.1–15.9)
Hematocrit: 35.2 % (ref 34.0–46.6)
IMMATURE GRANS (ABS): 0 10*3/uL (ref 0.0–0.1)
Immature Granulocytes: 0 %
LYMPHS: 41 %
Lymphocytes Absolute: 1.8 10*3/uL (ref 0.7–3.1)
MCH: 29.8 pg (ref 26.6–33.0)
MCHC: 33 g/dL (ref 31.5–35.7)
MCV: 91 fL (ref 79–97)
MONOCYTES: 8 %
Monocytes Absolute: 0.4 10*3/uL (ref 0.1–0.9)
NEUTROS PCT: 48 %
Neutrophils Absolute: 2.2 10*3/uL (ref 1.4–7.0)
PLATELETS: 280 10*3/uL (ref 150–450)
RBC: 3.89 x10E6/uL (ref 3.77–5.28)
RDW: 12.7 % (ref 12.3–15.4)
WBC: 4.4 10*3/uL (ref 3.4–10.8)

## 2018-02-08 LAB — TSH: TSH: 2.67 u[IU]/mL (ref 0.450–4.500)

## 2018-02-08 LAB — T4, FREE: FREE T4: 1.02 ng/dL (ref 0.82–1.77)

## 2018-02-08 LAB — VITAMIN D 25 HYDROXY (VIT D DEFICIENCY, FRACTURES): Vit D, 25-Hydroxy: 28.7 ng/mL — ABNORMAL LOW (ref 30.0–100.0)

## 2018-02-08 LAB — HEMOGLOBIN A1C
ESTIMATED AVERAGE GLUCOSE: 114 mg/dL
HEMOGLOBIN A1C: 5.6 % (ref 4.8–5.6)

## 2018-02-08 LAB — LIPID PANEL
CHOL/HDL RATIO: 3.1 ratio (ref 0.0–4.4)
Cholesterol, Total: 193 mg/dL (ref 100–199)
HDL: 63 mg/dL (ref >39)
LDL CALC: 115 mg/dL — AB (ref 0–99)
Triglycerides: 77 mg/dL (ref 0–149)
VLDL Cholesterol Cal: 15 mg/dL (ref 5–40)

## 2018-02-09 ENCOUNTER — Other Ambulatory Visit: Payer: Self-pay | Admitting: Family Medicine

## 2018-02-09 MED ORDER — VITAMIN D (ERGOCALCIFEROL) 1.25 MG (50000 UNIT) PO CAPS: 50000.0000 [IU] | ORAL_CAPSULE | ORAL | 3 refills | Status: DC

## 2018-02-21 DIAGNOSIS — Z1231 Encounter for screening mammogram for malignant neoplasm of breast: Secondary | ICD-10-CM | POA: Diagnosis not present

## 2018-02-21 DIAGNOSIS — Z803 Family history of malignant neoplasm of breast: Secondary | ICD-10-CM | POA: Diagnosis not present

## 2018-04-04 DIAGNOSIS — Z113 Encounter for screening for infections with a predominantly sexual mode of transmission: Secondary | ICD-10-CM | POA: Diagnosis not present

## 2018-04-04 DIAGNOSIS — Z1151 Encounter for screening for human papillomavirus (HPV): Secondary | ICD-10-CM | POA: Diagnosis not present

## 2018-04-04 DIAGNOSIS — Z01419 Encounter for gynecological examination (general) (routine) without abnormal findings: Secondary | ICD-10-CM | POA: Diagnosis not present

## 2018-04-04 DIAGNOSIS — Z124 Encounter for screening for malignant neoplasm of cervix: Secondary | ICD-10-CM | POA: Diagnosis not present

## 2018-04-04 DIAGNOSIS — Z6823 Body mass index (BMI) 23.0-23.9, adult: Secondary | ICD-10-CM | POA: Diagnosis not present

## 2018-04-05 ENCOUNTER — Encounter: Payer: Self-pay | Admitting: Family Medicine

## 2018-06-09 ENCOUNTER — Other Ambulatory Visit: Payer: BLUE CROSS/BLUE SHIELD

## 2018-06-22 DIAGNOSIS — L570 Actinic keratosis: Secondary | ICD-10-CM | POA: Diagnosis not present

## 2018-06-22 DIAGNOSIS — D485 Neoplasm of uncertain behavior of skin: Secondary | ICD-10-CM | POA: Diagnosis not present

## 2018-08-04 ENCOUNTER — Other Ambulatory Visit: Payer: Self-pay

## 2018-08-04 ENCOUNTER — Encounter: Payer: Self-pay | Admitting: Podiatry

## 2018-08-04 ENCOUNTER — Ambulatory Visit: Payer: BLUE CROSS/BLUE SHIELD | Admitting: Podiatry

## 2018-08-04 VITALS — BP 116/65 | HR 75 | Temp 97.5°F | Resp 16

## 2018-08-04 DIAGNOSIS — L603 Nail dystrophy: Secondary | ICD-10-CM

## 2018-08-04 DIAGNOSIS — L608 Other nail disorders: Secondary | ICD-10-CM | POA: Diagnosis not present

## 2018-08-04 NOTE — Progress Notes (Signed)
  Subjective:  Patient ID: Alisha Valencia, female    DOB: 1955-12-14,  MRN: [id number] HPI Chief Complaint  Patient presents with  . Nail Problem    Hallux nails bilateral - injury to nails Dec. 2019, noticed in January they started to look disfigured, toenails have fallen off before, tried tea tree oil- no help  . New Patient (Initial Visit)    63 y.o. female presents with the above complaint.   ROS: Denies fever chills nausea vomiting muscle aches pains calf pain back pain chest pain shortness of breath.  Past Medical History:  Diagnosis Date  . Chest pain   . Hypertension   . Palpitations   . SVT (supraventricular tachycardia) (HCC)    Past Surgical History:  Procedure Laterality Date  . APPENDECTOMY    . DILATATION & CURRETTAGE/HYSTEROSCOPY WITH RESECTOCOPE     x2  . Nuclear Stress Test  06/10/2012   Normal  . TONSILLECTOMY    . TUBAL LIGATION      Current Outpatient Medications:  .  Ascorbic Acid (VITAMIN C) 1000 MG tablet, Take 1,000 mg by mouth daily., Disp: , Rfl:  .  Cholecalciferol (VITAMIN D3) 5000 units TABS, 5,000 IU OTC vitamin D3 daily., Disp: 90 tablet, Rfl: 3 .  Vitamin D, Ergocalciferol, (DRISDOL) 1.25 MG (50000 UT) CAPS capsule, Take 1 capsule (50,000 Units total) by mouth every 7 (seven) days., Disp: 12 capsule, Rfl: 3  Allergies  Allergen Reactions  . Erythromycin    Review of Systems Objective:   Vitals:   08/04/18 0842  BP: 116/65  Pulse: 75  Resp: 16  Temp: (!) 97.5 F (36.4 C)    General: Well developed, nourished, in no acute distress, alert and oriented x3   Dermatological: Skin is warm, dry and supple bilateral. Nails x 10 are well maintained; remaining integument appears unremarkable at this time. There are no open sores, no preulcerative lesions, no rash or signs of infection present.  Hallux nail plates look to be deformed with thickening of the nail and distal Onikul lysis.  There is no signs of cutaneous tinea.  The nail plates are  broken off approximately one fourth of the way from the distal aspect of the toe.  They appear to be malformed.  Vascular: Dorsalis Pedis artery and Posterior Tibial artery pedal pulses are 2/4 bilateral with immedate capillary fill time. Pedal hair growth present. No varicosities and no lower extremity edema present bilateral.   Neruologic: Grossly intact via light touch bilateral. Vibratory intact via tuning fork bilateral. Protective threshold with Semmes Wienstein monofilament intact to all pedal sites bilateral. Patellar and Achilles deep tendon reflexes 2+ bilateral. No Babinski or clonus noted bilateral.   Musculoskeletal: No gross boney pedal deformities bilateral. No pain, crepitus, or limitation noted with foot and ankle range of motion bilateral. Muscular strength 5/5 in all groups tested bilateral.  Gait: Unassisted, Nonantalgic.    Radiographs:  None taken  Assessment & Plan:   Assessment: Nail dystrophy hallux bilateral  Plan: Samples of the nails skin were taken today to be sent for pathologic evaluation I will follow-up with her in 1 month for reevaluation.     Debby Clyne T. [place], [place]

## 2018-08-29 ENCOUNTER — Telehealth: Payer: Self-pay | Admitting: Podiatry

## 2018-08-29 NOTE — Telephone Encounter (Signed)
I called for her results, they are not ready at the lab yet. Can you push her appointment to June 30th so we will have enough time to get them and let her know then.

## 2018-08-29 NOTE — Telephone Encounter (Signed)
Wants to know if Alisha Valencia results have come in? Stated Dr. Milinda Pointer told her depending on the results, she would not need to keep her appointment for this Thursday, 25 June.

## 2018-08-30 ENCOUNTER — Telehealth: Payer: Self-pay | Admitting: *Deleted

## 2018-08-30 NOTE — Telephone Encounter (Signed)
I informed pt of Dr. Stephenie Acres review of results. Pt states she is taking biotin and using an oil on them and they look better.

## 2018-08-30 NOTE — Telephone Encounter (Signed)
-----   Message from Garrel Ridgel, [place] sent at 08/30/2018  4:39 PM EDT ----- No nail fungus.  Positive nail dystrophy.

## 2018-08-31 NOTE — Telephone Encounter (Signed)
Can you look at his schedule for the 30 th and let me know a time I should schedule her for? I'm only asking for your help because Dr. Milinda Pointer only has new patient slots available that day. Thanks.

## 2018-08-31 NOTE — Telephone Encounter (Signed)
Do 3:30. We might not even need her to come in anyways, but just put her there for now. Thanks!

## 2018-09-01 ENCOUNTER — Ambulatory Visit: Payer: BLUE CROSS/BLUE SHIELD | Admitting: Podiatry

## 2018-09-05 NOTE — Telephone Encounter (Signed)
Have we received her results yet? I wanted to check before I called and schedule the pt an appointment.

## 2018-09-05 NOTE — Telephone Encounter (Signed)
Thanks for letting me know. Will you call the pt and let her know as well if you already have not?

## 2018-09-05 NOTE — Telephone Encounter (Signed)
Informed patient of results of fungal culture. Patient already taking Biotin and using a tea tree oil mix on toenails and says already looking better.

## 2018-09-05 NOTE — Telephone Encounter (Signed)
Results are in, but no fungus was identified. No appointment is needed at this time.

## 2018-09-16 DIAGNOSIS — D2272 Melanocytic nevi of left lower limb, including hip: Secondary | ICD-10-CM | POA: Diagnosis not present

## 2018-09-16 DIAGNOSIS — D225 Melanocytic nevi of trunk: Secondary | ICD-10-CM | POA: Diagnosis not present

## 2018-09-16 DIAGNOSIS — Z85828 Personal history of other malignant neoplasm of skin: Secondary | ICD-10-CM | POA: Diagnosis not present

## 2018-09-16 DIAGNOSIS — Z808 Family history of malignant neoplasm of other organs or systems: Secondary | ICD-10-CM | POA: Diagnosis not present

## 2018-12-20 ENCOUNTER — Ambulatory Visit (INDEPENDENT_AMBULATORY_CARE_PROVIDER_SITE_OTHER): Payer: BC Managed Care – PPO | Admitting: Adult Health

## 2018-12-20 ENCOUNTER — Other Ambulatory Visit: Payer: Self-pay

## 2018-12-20 ENCOUNTER — Encounter: Payer: Self-pay | Admitting: Adult Health

## 2018-12-20 VITALS — Temp 98.9°F | Ht 65.0 in | Wt 136.0 lb

## 2018-12-20 DIAGNOSIS — R8290 Unspecified abnormal findings in urine: Secondary | ICD-10-CM | POA: Diagnosis not present

## 2018-12-20 DIAGNOSIS — R300 Dysuria: Secondary | ICD-10-CM

## 2018-12-20 LAB — POCT URINALYSIS DIPSTICK
Bilirubin, UA: NEGATIVE
Glucose, UA: NEGATIVE
Nitrite, UA: POSITIVE
Protein, UA: POSITIVE — AB
Spec Grav, UA: >=1.03 — AB (ref 1.010–1.025)
Urobilinogen, UA: 0.2 E.U./dL
pH, UA: 5.5 (ref 5.0–8.0)

## 2018-12-20 MED ORDER — CIPROFLOXACIN HCL 500 MG PO TABS: 500.0000 mg | ORAL_TABLET | Freq: Two times a day (BID) | ORAL | 0 refills | Status: DC

## 2018-12-20 NOTE — Progress Notes (Signed)
Virtual Visit via Telephone Note  I connected with Alisha Valencia on 12/20/18 at  3:00 PM EDT by telephone and verified that I am speaking with the correct person using two identifiers.  Location: Patient: Home Provider: In Clinic   I discussed the limitations, risks, security and privacy concerns of performing an evaluation and management service by telephone and the availability of in person appointments. I also discussed with the patient that there may be a patient responsible charge related to this service. The patient expressed understanding and agreed to proceed.   History of Present Illness: Ms. Alisha Valencia calls in with urinary frequency, hematuria, dysuria, L lower back pain (intermittent dull ache, rated 4/10-8/10). She reports sx's presents for >4 days. She denies fever/N/V, however she reports loose stools- 3 per day the last 48hrs- no diarrhea today. She denies abdominal pain. She reports hx of nephrolithiasis- last occurrence was >25 yrs ago. She denies any OTC remedies, but has been increasing PO fluid intake CMP 02/2018 GFR 92 Patient Care Team    Relationship Specialty Notifications Start End  Mellody Dance, DO PCP - General Family Medicine  08/01/15   Jari Pigg, MD Consulting Physician Dermatology  09/20/15   Servando Salina, MD Consulting Physician Obstetrics and Gynecology  12/31/16   Juanita Craver, MD Consulting Physician Gastroenterology  12/31/16   Sanda Klein, MD Consulting Physician Cardiology  12/31/16    Comment: only saw 1 time or so in 2014    Patient Active Problem List   Diagnosis Date Noted  . Osteopenia after menopause 02/07/2018  . Sialoadenitis 04/29/2017  . L Parotiditis- presumed since PE WNL 04/26/2017  . Benign neoplasm of thyroid 04/01/2017  . Vitamin D deficiency 12/31/2016  . Arthritis of both hands 09/29/2015  . h/o Hypertension 09/29/2015  . Well woman exam with routine gynecological exam 09/29/2015  . Squamous cell skin cancer  09/20/2015  . Solitary pulmonary nodule 09/03/2015  . Family history of diabetes mellitus in first degree relative 09/03/2015  . Cough 08/01/2015  . Esophageal reflux 08/01/2015     Past Medical History:  Diagnosis Date  . Chest pain   . Hypertension   . Palpitations   . SVT (supraventricular tachycardia) (HCC)      Past Surgical History:  Procedure Laterality Date  . APPENDECTOMY    . DILATATION & CURRETTAGE/HYSTEROSCOPY WITH RESECTOCOPE     x2  . Nuclear Stress Test  06/10/2012   Normal  . TONSILLECTOMY    . TUBAL LIGATION       Family History  Problem Relation Age of Onset  . Heart failure Father   . Heart attack Father   . Diabetes Sister      Social History   Substance and Sexual Activity  Drug Use No     Social History   Substance and Sexual Activity  Alcohol Use No     Social History   Tobacco Use  Smoking Status Never Smoker  Smokeless Tobacco Never Used     Outpatient Encounter Medications as of 12/20/2018  Medication Sig Note  . Ascorbic Acid (VITAMIN C) 1000 MG tablet Take 1,000 mg by mouth daily.   . Lactobacillus Rhamnosus, GG, (CULTURELLE PO) Take 1 tablet by mouth daily.   . Vitamin D, Ergocalciferol, (DRISDOL) 1.25 MG (50000 UT) CAPS capsule Take 1 capsule (50,000 Units total) by mouth every 7 (seven) days.   . Zinc 50 MG TABS Take 1 tablet by mouth daily.   . [DISCONTINUED] Cholecalciferol (VITAMIN D3) 5000 units  TABS 5,000 IU OTC vitamin D3 daily. 12/31/2016: Patient is not taking everyday   . [DISCONTINUED] Zinc Sulfate (ZINC-220 PO) Take 1 tablet by mouth daily.    No facility-administered encounter medications on file as of 12/20/2018.     Allergies: Erythromycin  Body mass index is 22.63 kg/m.  Temperature 98.9 F (37.2 C), temperature source Oral, height 5\' 5"  (1.651 m), weight 136 lb (61.7 kg). Review of Systems: General:   Denies fever, chills, unexplained weight loss.  Optho/Auditory:   Denies visual changes,  blurred vision/LOV Respiratory:   Denies SOB, DOE more than baseline levels.  Cardiovascular:   Denies chest pain, palpitations, new onset peripheral edema  Gastrointestinal: diarrhea+,   Denies nausea, vomiting,  Genitourinary: Dysuria, freq/ urgency, flank pain + Denies discharge from genitals.  Endocrine:     Denies hot or cold intolerance, polyuria, polydipsia. Musculoskeletal:   Denies unexplained myalgias, joint swelling, unexplained arthralgias, gait problems.  Skin:  Denies rash, suspicious lesions Neurological:     Denies dizziness, unexplained weakness, numbness  Psychiatric/Behavioral:   Denies mood changes, suicidal or homicidal ideations, hallucinations    Observations/Objective: No acute distress noted during the telephone conversation.  Assessment and Plan: Urine sent for C/S UA: Blood:Mod Nitrite: + Leu: + Plus Back pain- will treat with Ciprofloxacin 500mg  BID x 5 days Continue to push fluids. OTC Acetaminophen as needed.  Follow Up Instructions: PRN   I discussed the assessment and treatment plan with the patient. The patient was provided an opportunity to ask questions and all were answered. The patient agreed with the plan and demonstrated an understanding of the instructions.   The patient was advised to call back or seek an in-person evaluation if the symptoms worsen or if the condition fails to improve as anticipated.  I provided 8 minutes of non-face-to-face time during this encounter.   Esaw Grandchild, NP

## 2018-12-20 NOTE — Assessment & Plan Note (Signed)
Assessment and Plan: Urine sent for C/S UA: Blood:Mod Nitrite: + Leu: + Plus Back pain- will treat with Ciprofloxacin 500mg  BID x 5 days Continue to push fluids. OTC Acetaminophen as needed.  Follow Up Instructions: PRN   I discussed the assessment and treatment plan with the patient. The patient was provided an opportunity to ask questions and all were answered. The patient agreed with the plan and demonstrated an understanding of the instructions.   The patient was advised to call back or seek an in-person evaluation if the symptoms worsen or if the condition fails to improve as anticipated.  I provided 8 minutes of non-face-to-face time during this encounter.

## 2018-12-23 ENCOUNTER — Encounter: Payer: Self-pay | Admitting: Adult Health

## 2018-12-23 LAB — CULTURE, URINE COMPREHENSIVE

## 2019-01-05 ENCOUNTER — Other Ambulatory Visit: Payer: Self-pay | Admitting: Family Medicine

## 2019-01-06 ENCOUNTER — Telehealth: Payer: Self-pay | Admitting: Family Medicine

## 2019-01-06 MED ORDER — VITAMIN D (ERGOCALCIFEROL) 1.25 MG (50000 UNIT) PO CAPS: 50000.0000 [IU] | ORAL_CAPSULE | ORAL | 0 refills | Status: DC

## 2019-01-06 NOTE — Telephone Encounter (Signed)
Please call patient and schedule CPE in January. AS, CMA

## 2019-01-06 NOTE — Telephone Encounter (Signed)
Patient called to schedule annual CPE & FBW but advised her scheduling none in 2020 but waiting til 2021  --Patient then ask if provider will refill :   Vitamin D, Ergocalciferol, (DRISDOL) 1.25 MG (50000 UT) CAPS capsule [id number]   Order Details Dose: 50,000 Units Route: Oral Frequency: Every 7 days  Indications of Use: Vitamin D Deficiency  Dispense Quantity: 12 capsule Refills: 3 Fills remaining: --        Sig: Take 1 capsule (50,000 Units total) by mouth every 7 (seven) days.          --Forwarding  Request to medical asst that if approved send refill order to:    Preferred Pharmacies      [id number] - [place], [place] [id number] (Phone) [phone] (Fax)   --glh

## 2019-01-27 ENCOUNTER — Telehealth: Payer: Self-pay | Admitting: Family Medicine

## 2019-01-27 NOTE — Telephone Encounter (Signed)
Called pt to set up :  Please call patient and schedule CPE in January. AS, CMA  --01/27/19 No answer.U/a to left message will cll back .  --glh

## 2019-02-24 DIAGNOSIS — Z803 Family history of malignant neoplasm of breast: Secondary | ICD-10-CM | POA: Diagnosis not present

## 2019-02-24 DIAGNOSIS — Z1231 Encounter for screening mammogram for malignant neoplasm of breast: Secondary | ICD-10-CM | POA: Diagnosis not present

## 2019-02-24 LAB — HM MAMMOGRAPHY

## 2019-03-20 ENCOUNTER — Ambulatory Visit: Payer: BC Managed Care – PPO | Attending: Internal Medicine

## 2019-03-20 DIAGNOSIS — Z20822 Contact with and (suspected) exposure to covid-19: Secondary | ICD-10-CM

## 2019-03-22 LAB — NOVEL CORONAVIRUS, NAA: SARS-CoV-2, NAA: NOT DETECTED

## 2019-04-17 DIAGNOSIS — B001 Herpesviral vesicular dermatitis: Secondary | ICD-10-CM | POA: Diagnosis not present

## 2019-04-28 DIAGNOSIS — R070 Pain in throat: Secondary | ICD-10-CM | POA: Diagnosis not present

## 2019-04-28 DIAGNOSIS — Z20822 Contact with and (suspected) exposure to covid-19: Secondary | ICD-10-CM | POA: Diagnosis not present

## 2019-05-04 ENCOUNTER — Telehealth: Payer: BC Managed Care – PPO | Admitting: Emergency Medicine

## 2019-05-04 DIAGNOSIS — Z8619 Personal history of other infectious and parasitic diseases: Secondary | ICD-10-CM

## 2019-05-04 MED ORDER — VALACYCLOVIR HCL 1 G PO TABS: ORAL_TABLET | ORAL | 1 refills | Status: DC

## 2019-05-04 NOTE — Progress Notes (Signed)
We are sorry that you are not feeling well.  Here is how we plan to help!  Based on what you have shared with me it does look like you have a viral infection.    Most cold sores or fever blisters are small fluid filled blisters around the mouth caused by herpes simplex virus.  The most common strain of the virus causing cold sores is herpes simplex virus 1.  It can be spread by skin contact, sharing eating utensils, or even sharing towels.  Cold sores are contagious to other people until dry. (Approximately 5-7 days).  Wash your hands. You can spread the virus to your eyes through handling your contact lenses after touching the lesions.  Most people experience pain at the sight or tingling sensations in their lips that may begin before the ulcers erupt.  Herpes simplex is treatable but not curable.  It may lie dormant for a long time and then reappear due to stress or prolonged sun exposure.  Many patients have success in treating their cold sores with an over the counter topical called Abreva.  You may apply the cream up to 5 times daily (maximum 10 days) until healing occurs.  If you would like to use an oral antiviral medication to speed the healing of your cold sore, I have sent a prescription to your local pharmacy Valacyclovir 2 gm take one by mouth twice a day for 1 day    I've given you 1 refill.  You can take this on the day after your procedure.  You should ask your plastic surgeon for an additional refills as needed.  HOME CARE:   Wash your hands frequently.  Do not pick at or rub the sore.  Don't open the blisters.  Avoid kissing other people during this time.  Avoid sharing drinking glasses, eating utensils, or razors.  Do not handle contact lenses unless you have thoroughly washed your hands with soap and warm water!  Avoid oral sex during this time.  Herpes from sores on your mouth can spread to your partner's genital area.  Avoid contact with anyone who has eczema or a  weakened immune system.  Cold sores are often triggered by exposure to intense sunlight, use a lip balm containing a sunscreen (SPF 30 or higher).  GET HELP RIGHT AWAY IF:   Blisters look infected.  Blisters occur near or in the eye.  Symptoms last longer than 10 days.  Your symptoms become worse.  MAKE SURE YOU:   Understand these instructions.  Will watch your condition.  Will get help right away if you are not doing well or get worse.    Your e-visit answers were reviewed by a board certified advanced clinical practitioner to complete your personal care plan.  Depending upon the condition, your plan could have  Included both over the counter or prescription medications.    Please review your pharmacy choice.  Be sure that the pharmacy you have chosen is open so that you can pick up your prescription now.  If there is a problem you can message your provider in [place] to have the prescription routed to another pharmacy.    Your safety is important to [place].  If you have drug allergies check our prescription carefully.  For the next 24 hours you can use MyChart to ask questions about today's visit, request a non-urgent call back, or ask for a work or school excuse from your e-visit provider.  You will get an email in  the next two days asking about your experience.  I hope that your e-visit has been valuable and will speed your recovery. Approximately 5 minutes was used in reviewing the patient's chart, questionnaire, prescribing medications, and documentation.

## 2019-05-18 ENCOUNTER — Telehealth: Payer: Self-pay | Admitting: Family Medicine

## 2019-05-18 NOTE — Telephone Encounter (Signed)
Patient has not been seen by Dr. Raliegh Scarlet since 02/2018. Patient is requesting a refill of Valtrex. Patient states she is receiving laser treatments for age spots on her face and that after her last laser treatment she has an issue with sore. She states that she reached out to our office but we were closed so she did a telehealth visit with her insurance company and was prescribed Valtrex. She states she has another treatment coming up and needs a refill on this med. Patient has never been prescribed this medication by Dr. Raliegh Scarlet. I advised patient that she would likely have to be seen and evaluated by Dr. Raliegh Scarlet for this medication. Patient states it is not fair because she has paid a lot of money for these laser treatments and it isnt her fault that she was unable to be seen. I advised patient that I would speak with Dr. Raliegh Scarlet and be back in contact.   Patient states that she called last week (there is not a message in the chart) and spoke with front desk and was supposed to get a call back in regards to the valtrex prescription. It looks like patient called Monday 05/15/19 and scheduled a CPE apt.   Per Dr. Raliegh Scarlet- patient will need to be seen and evaluated before we can prescribe this medication that we have never prescribed her before. Patient also will need to be seen since we have not seen her in over a year.   Left message for patient to call back. AS, CMA

## 2019-05-18 NOTE — Telephone Encounter (Signed)
LMTCB. AS, CMA 

## 2019-05-18 NOTE — Telephone Encounter (Signed)
Patient is requesting a call back from clinic staff to discuss a possible refill of her Valtrex. She is needing in part of a facial procedure she has coming up, but wants to discuss the possibilities of getting this med refilled and the amount that she would need as the procedure is a multiple stage/visit scenario.

## 2019-05-19 ENCOUNTER — Telehealth: Payer: Self-pay | Admitting: Family Medicine

## 2019-05-19 NOTE — Telephone Encounter (Signed)
05/19/2019  Pt informed of need for OV for RX for valtrex.  Pt declined and requested that we cancel all appts at our office as she will be changing providers.  [place] informed to cancel all appts.  Charyl Bigger, CMA

## 2019-05-19 NOTE — Telephone Encounter (Signed)
Patient called and left VM at end of the day returning Alisha Valencia phone call about med refill. Patient states on VM that Alisha Valencia was going to see about getting this refill ASAP but needed approval from PCP. Please advise

## 2019-05-19 NOTE — Telephone Encounter (Signed)
05/19/2019  See documentation from telephone message on 05/18/2019.  Charyl Bigger, CMA

## 2019-05-31 DIAGNOSIS — Z0000 Encounter for general adult medical examination without abnormal findings: Secondary | ICD-10-CM | POA: Diagnosis not present

## 2019-06-01 DIAGNOSIS — E559 Vitamin D deficiency, unspecified: Secondary | ICD-10-CM | POA: Diagnosis not present

## 2019-06-02 DIAGNOSIS — E7800 Pure hypercholesterolemia, unspecified: Secondary | ICD-10-CM | POA: Diagnosis not present

## 2019-06-02 DIAGNOSIS — Z0000 Encounter for general adult medical examination without abnormal findings: Secondary | ICD-10-CM | POA: Diagnosis not present

## 2019-06-14 DIAGNOSIS — Z1151 Encounter for screening for human papillomavirus (HPV): Secondary | ICD-10-CM | POA: Diagnosis not present

## 2019-06-14 DIAGNOSIS — Z01419 Encounter for gynecological examination (general) (routine) without abnormal findings: Secondary | ICD-10-CM | POA: Diagnosis not present

## 2019-06-14 DIAGNOSIS — Z6823 Body mass index (BMI) 23.0-23.9, adult: Secondary | ICD-10-CM | POA: Diagnosis not present

## 2019-06-14 LAB — HM PAP SMEAR: HM Pap smear: NORMAL

## 2019-06-19 ENCOUNTER — Other Ambulatory Visit: Payer: BC Managed Care – PPO

## 2019-06-19 ENCOUNTER — Encounter: Payer: BC Managed Care – PPO | Admitting: Family Medicine

## 2019-06-21 DIAGNOSIS — H43813 Vitreous degeneration, bilateral: Secondary | ICD-10-CM | POA: Diagnosis not present

## 2019-06-21 DIAGNOSIS — H2513 Age-related nuclear cataract, bilateral: Secondary | ICD-10-CM | POA: Diagnosis not present

## 2019-06-21 DIAGNOSIS — H524 Presbyopia: Secondary | ICD-10-CM | POA: Diagnosis not present

## 2019-06-21 DIAGNOSIS — H5203 Hypermetropia, bilateral: Secondary | ICD-10-CM | POA: Diagnosis not present

## 2019-08-01 DIAGNOSIS — D2272 Melanocytic nevi of left lower limb, including hip: Secondary | ICD-10-CM | POA: Diagnosis not present

## 2019-08-01 DIAGNOSIS — D485 Neoplasm of uncertain behavior of skin: Secondary | ICD-10-CM | POA: Diagnosis not present

## 2019-08-16 DIAGNOSIS — Z23 Encounter for immunization: Secondary | ICD-10-CM | POA: Diagnosis not present

## 2019-08-31 DIAGNOSIS — D485 Neoplasm of uncertain behavior of skin: Secondary | ICD-10-CM | POA: Diagnosis not present

## 2019-08-31 DIAGNOSIS — L988 Other specified disorders of the skin and subcutaneous tissue: Secondary | ICD-10-CM | POA: Diagnosis not present

## 2019-08-31 DIAGNOSIS — G4452 New daily persistent headache (NDPH): Secondary | ICD-10-CM | POA: Diagnosis not present

## 2019-10-05 DIAGNOSIS — D2372 Other benign neoplasm of skin of left lower limb, including hip: Secondary | ICD-10-CM | POA: Diagnosis not present

## 2019-10-05 DIAGNOSIS — D485 Neoplasm of uncertain behavior of skin: Secondary | ICD-10-CM | POA: Diagnosis not present

## 2019-10-05 DIAGNOSIS — D225 Melanocytic nevi of trunk: Secondary | ICD-10-CM | POA: Diagnosis not present

## 2019-10-05 DIAGNOSIS — L821 Other seborrheic keratosis: Secondary | ICD-10-CM | POA: Diagnosis not present

## 2019-10-05 DIAGNOSIS — L570 Actinic keratosis: Secondary | ICD-10-CM | POA: Diagnosis not present

## 2019-11-13 IMAGING — US US THYROID
1 series · 13 of 25 positions shown · non-contrast
Comparison: CT neck 10/16/2008

CLINICAL DATA: Neck swelling

EXAM:
THYROID ULTRASOUND
TECHNIQUE: Ultrasound examination of the thyroid gland and adjacent soft
tissues was performed.

[Series 1: us thyroid · 0.06mm/px · 13 of 54 slices shown]
[im 1/54]
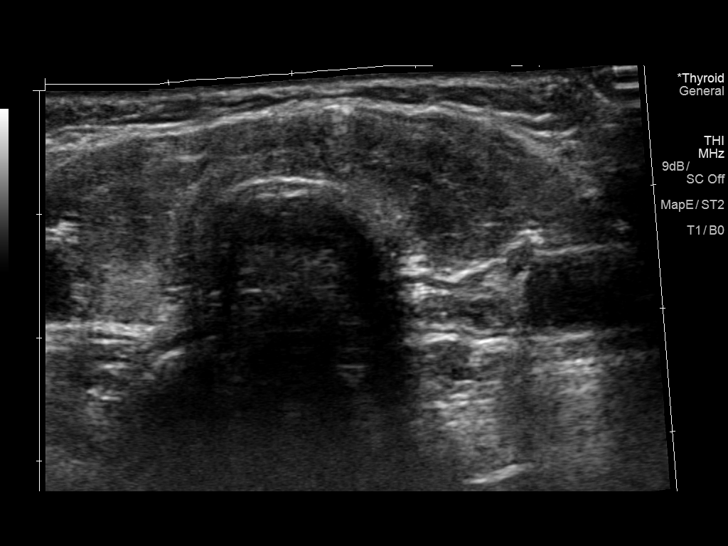
[im 5/54]
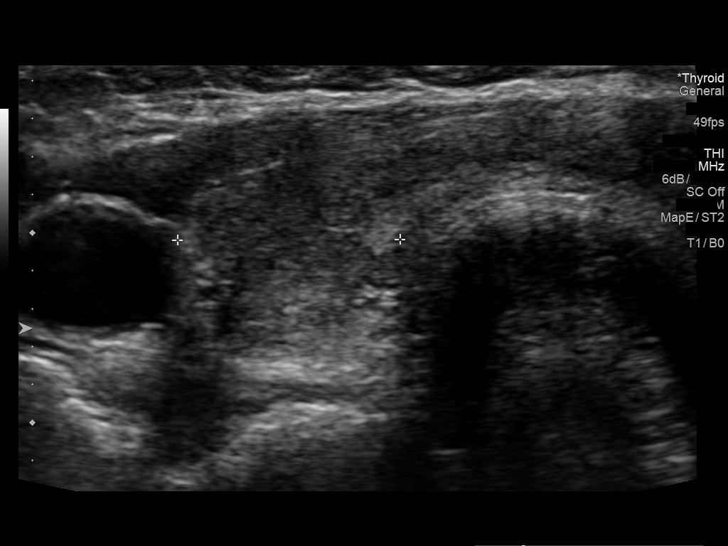
[im 9/54]
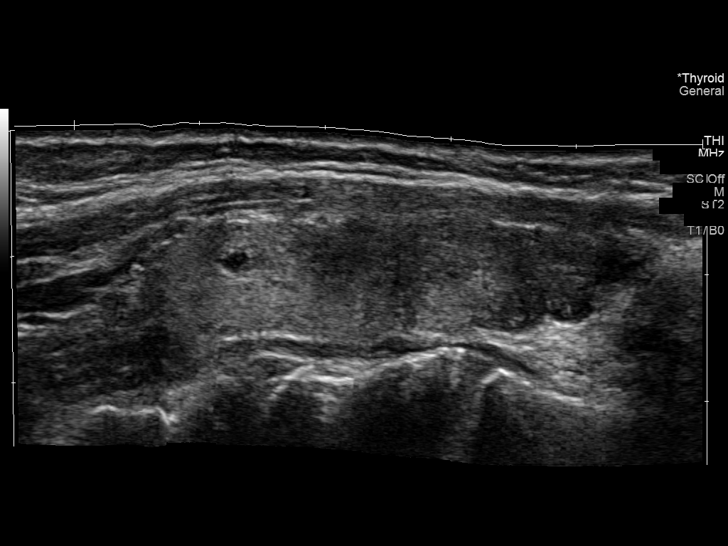
[im 14/54]
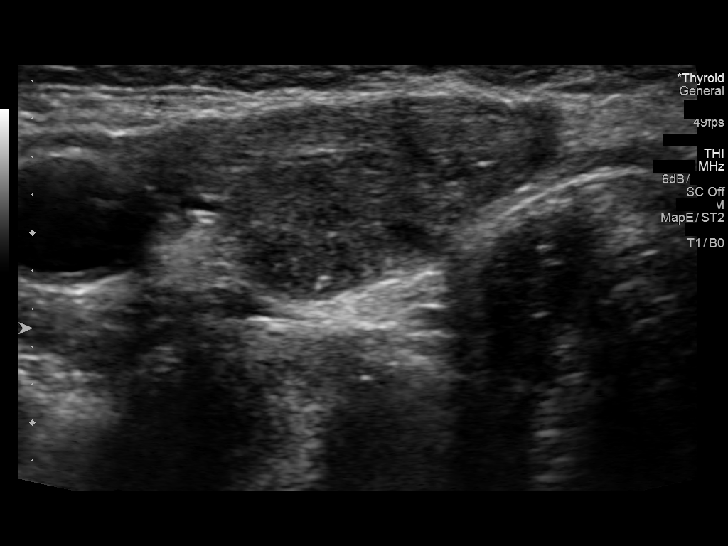
[im 18/54]
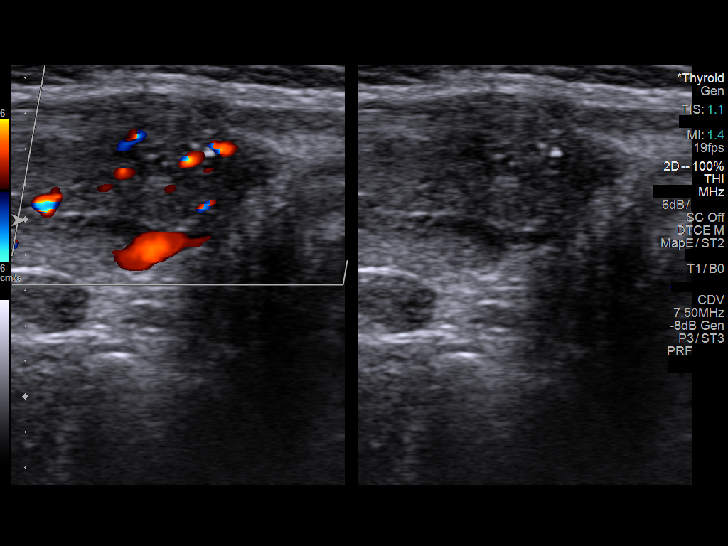
[im 23/54]
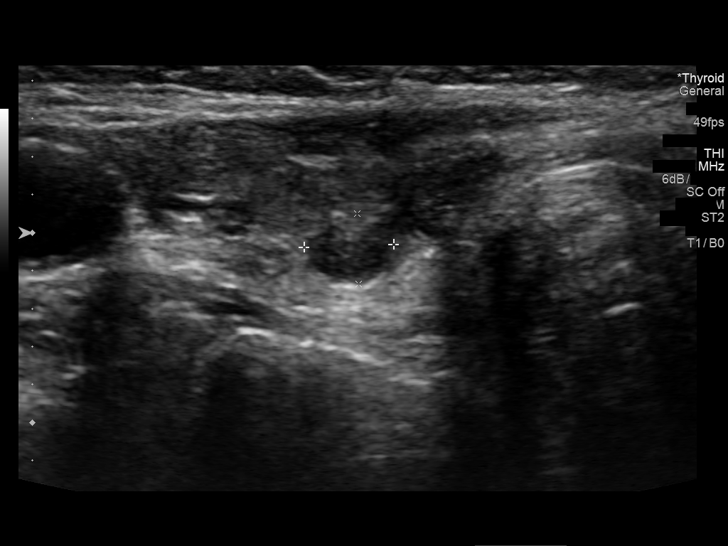
[im 27/54]
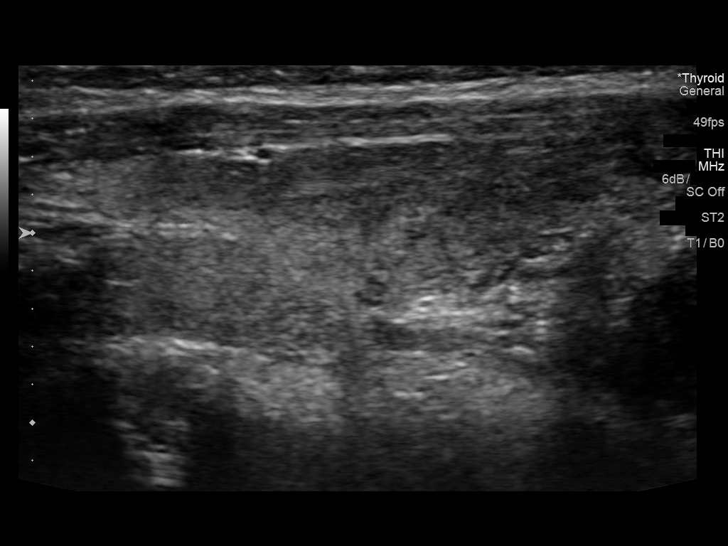
[im 31/54]
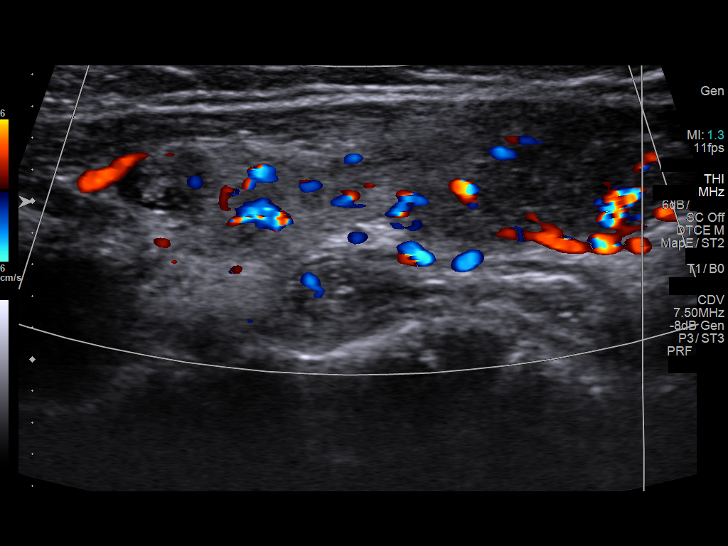
[im 36/54]
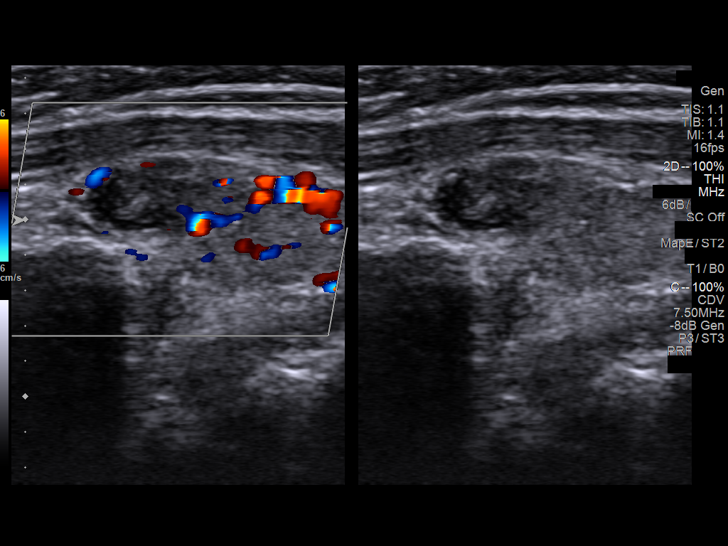
[im 40/54]
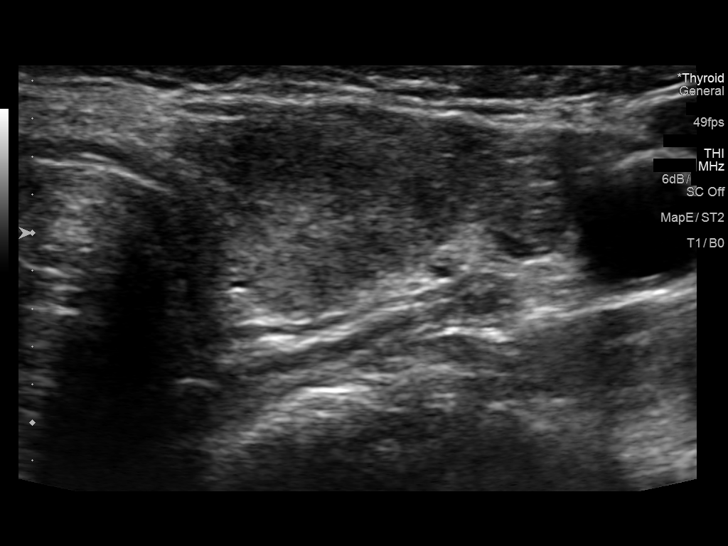
[im 45/54]
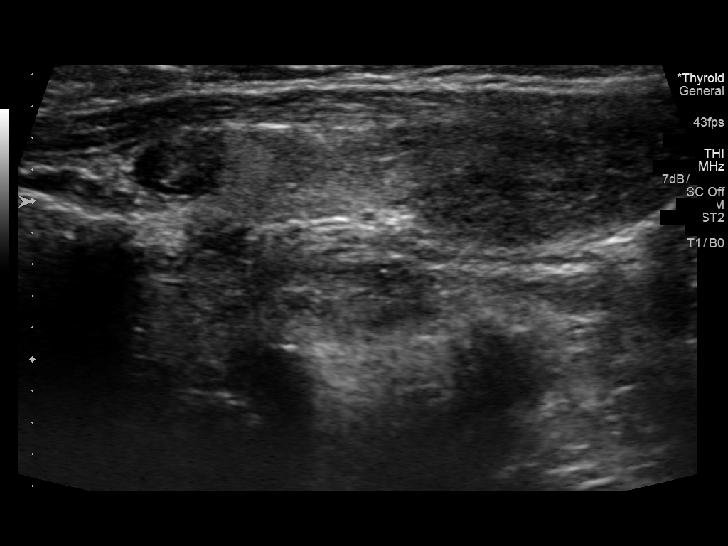
[im 49/54]
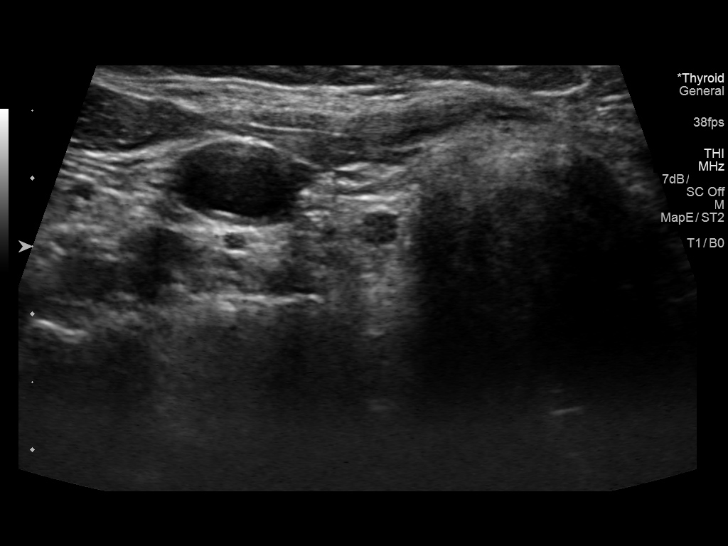
[im 54/54]
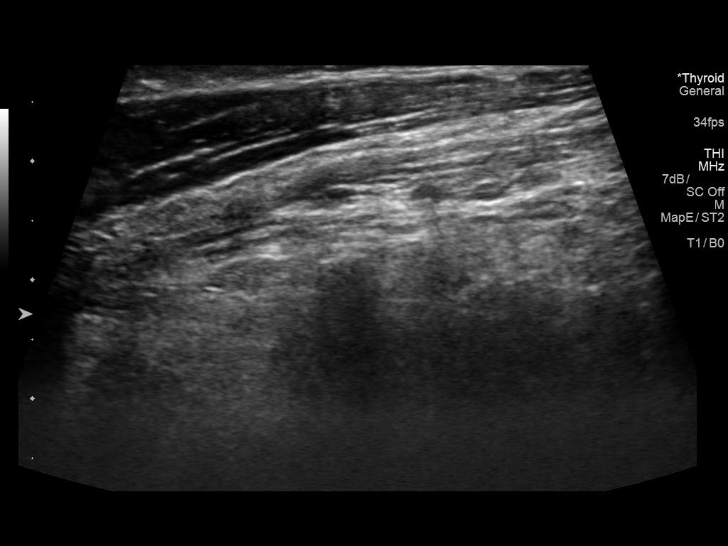

[13 of 25 positions shown; findings below may reference images not displayed]

FINDINGS: Parenchymal Echotexture: Moderately heterogenous

Isthmus: 0.5 cm thickness

Right lobe: 4.1 x 1.1 x 1.2 cm

Left lobe: 4 x 0.9 x 1.4 cm

_________________________________________________________

Estimated total number of nodules >/= 1 cm: 0

Number of spongiform nodules >/=  2 cm not described below (TR1): 0

Number of mixed cystic and solid nodules >/= 1.5 cm not described
below (TR2): 0

_________________________________________________________

Nodule # 1:

Location: Right; Inferior

Maximum size: 0.5 cm; Other 2 dimensions: 0.5 x 0.3 cm

Composition: solid/almost completely solid (2)

Echogenicity: isoechoic (1)

Shape: not taller-than-wide (0)

Margins: ill-defined (0)

Echogenic foci: punctate echogenic foci (3)

ACR TI-RADS total points: 6.

ACR TI-RADS risk category: TR4 (4-6 points).

ACR TI-RADS recommendations:

Given size (<0.9 cm) and appearance, this nodule does NOT meet
TI-RADS criteria for biopsy or dedicated follow-up.

_________________________________________________________

0.5 cm hypoechoic nodule without calcifications, inferior right.  If

0.6 cm complex nodule without calcifications, superior left
IMPRESSION: 1. Normal-sized thyroid with small bilateral nodules. None meets
criteria for biopsy or dedicated imaging follow-up.

The above is in keeping with the ACR TI-RADS recommendations - [HOSPITAL] 0950;[DATE].

## 2020-01-09 DIAGNOSIS — N644 Mastodynia: Secondary | ICD-10-CM | POA: Diagnosis not present

## 2020-01-26 DIAGNOSIS — N644 Mastodynia: Secondary | ICD-10-CM | POA: Diagnosis not present

## 2020-02-07 DIAGNOSIS — R509 Fever, unspecified: Secondary | ICD-10-CM | POA: Diagnosis not present

## 2020-02-07 DIAGNOSIS — R197 Diarrhea, unspecified: Secondary | ICD-10-CM | POA: Diagnosis not present

## 2020-03-18 DIAGNOSIS — Z780 Asymptomatic menopausal state: Secondary | ICD-10-CM | POA: Diagnosis not present

## 2020-03-18 DIAGNOSIS — Z01419 Encounter for gynecological examination (general) (routine) without abnormal findings: Secondary | ICD-10-CM | POA: Diagnosis not present

## 2020-06-20 DIAGNOSIS — Z13220 Encounter for screening for lipoid disorders: Secondary | ICD-10-CM | POA: Diagnosis not present

## 2020-06-20 DIAGNOSIS — Z0000 Encounter for general adult medical examination without abnormal findings: Secondary | ICD-10-CM | POA: Diagnosis not present

## 2020-06-20 DIAGNOSIS — E559 Vitamin D deficiency, unspecified: Secondary | ICD-10-CM | POA: Diagnosis not present

## 2020-06-27 DIAGNOSIS — Z0000 Encounter for general adult medical examination without abnormal findings: Secondary | ICD-10-CM | POA: Diagnosis not present

## 2020-07-03 DIAGNOSIS — H524 Presbyopia: Secondary | ICD-10-CM | POA: Diagnosis not present

## 2020-07-03 DIAGNOSIS — H5203 Hypermetropia, bilateral: Secondary | ICD-10-CM | POA: Diagnosis not present

## 2020-07-03 DIAGNOSIS — H2513 Age-related nuclear cataract, bilateral: Secondary | ICD-10-CM | POA: Diagnosis not present

## 2020-09-26 DIAGNOSIS — R002 Palpitations: Secondary | ICD-10-CM | POA: Diagnosis not present

## 2020-09-26 DIAGNOSIS — R5383 Other fatigue: Secondary | ICD-10-CM | POA: Diagnosis not present

## 2020-09-26 DIAGNOSIS — R079 Chest pain, unspecified: Secondary | ICD-10-CM | POA: Diagnosis not present

## 2020-09-26 DIAGNOSIS — R000 Tachycardia, unspecified: Secondary | ICD-10-CM | POA: Diagnosis not present

## 2020-10-09 DIAGNOSIS — L578 Other skin changes due to chronic exposure to nonionizing radiation: Secondary | ICD-10-CM | POA: Diagnosis not present

## 2020-10-09 DIAGNOSIS — D225 Melanocytic nevi of trunk: Secondary | ICD-10-CM | POA: Diagnosis not present

## 2020-10-09 DIAGNOSIS — L821 Other seborrheic keratosis: Secondary | ICD-10-CM | POA: Diagnosis not present

## 2020-10-09 DIAGNOSIS — D2372 Other benign neoplasm of skin of left lower limb, including hip: Secondary | ICD-10-CM | POA: Diagnosis not present

## 2020-12-11 ENCOUNTER — Ambulatory Visit: Payer: BC Managed Care – PPO | Admitting: Cardiovascular Disease

## 2021-02-14 DIAGNOSIS — Z780 Asymptomatic menopausal state: Secondary | ICD-10-CM | POA: Diagnosis not present

## 2021-02-14 DIAGNOSIS — Z803 Family history of malignant neoplasm of breast: Secondary | ICD-10-CM | POA: Diagnosis not present

## 2021-02-14 DIAGNOSIS — Z1231 Encounter for screening mammogram for malignant neoplasm of breast: Secondary | ICD-10-CM | POA: Diagnosis not present

## 2021-02-14 DIAGNOSIS — M8588 Other specified disorders of bone density and structure, other site: Secondary | ICD-10-CM | POA: Diagnosis not present

## 2021-03-26 ENCOUNTER — Other Ambulatory Visit: Payer: Self-pay

## 2021-03-26 ENCOUNTER — Ambulatory Visit: Payer: BC Managed Care – PPO | Admitting: Cardiovascular Disease

## 2021-03-26 ENCOUNTER — Encounter: Payer: Self-pay | Admitting: Cardiovascular Disease

## 2021-03-26 VITALS — BP 132/78 | HR 76 | Ht 65.0 in | Wt 148.8 lb

## 2021-03-26 DIAGNOSIS — R002 Palpitations: Secondary | ICD-10-CM

## 2021-03-26 DIAGNOSIS — R55 Syncope and collapse: Secondary | ICD-10-CM | POA: Diagnosis not present

## 2021-03-26 NOTE — Patient Instructions (Addendum)

## 2021-03-26 NOTE — Progress Notes (Signed)
Cardiology Office Note:    Date:  03/26/2021   ID:  HELMI HECHAVARRIA, DOB 01-09-1956, MRN [id number]  PCP:  Deland Pretty, MD   [facility] Providers Cardiologist:  Sanda Klein, MD   Referring MD: Deland Pretty, MD   Chief Complaint  Patient presents with   New Patient (Initial Visit)   Palpitations        Loss of Consciousness       Alisha Valencia is a 66 y.o. female who is being seen today for the evaluation of near-syncope and palpitations at the request of Deland Pretty, MD.     History of Present Illness:    Alisha Valencia is a 66 y.o. female with a hx of generally good health without major risk factors for heart disease, who has had problems with palpitations and had several episodes of near syncope.  Her symptoms were more serious last fall and have mostly subsided by this time.  In September or October of last year she was sitting at work when she suddenly felt very dizzy, was afraid that she might pass out.  Coughing seemed to interrupt these complaints.  The dizziness did not occur with change in position.  She has not experienced full-blown syncope.  Independently she has noticed more frequent palpitations especially if she lies on her left side.  Sometimes these prevent her from falling asleep unless she turns over on the other side.  Her symptoms of palpitations and dizziness never bother her during exercise (she walks).  She has an Apple watch, fourth generation.  The pulse sometimes seems to abruptly increased from the 50s to the 150s and then back normal within a matter of a few minutes.  Sometimes she is aware of palpitations when this occurs.  She describes the palpitations as regular and strong.  She has recorded more than 100 tracings of her electrocardiogram with the apple watch, most of which showed normal sinus rhythm or mild sinus tachycardia (fastest heart rate recorded electrically 115 bpm).  I did not see any PACs or PVCs or true arrhythmia on any of the  available tracings.  When she wore an event monitor in 2011 she had an episode of tachycardia at about 180 bpm with distinct P waves preceding the QRS, suggestive of sinus tachycardia (at that age this would have represented 108% of maximum predicted heart rate).  Treadmill nuclear stress test and echocardiogram in 2014 showed normal findings with normal left ventricular systolic and diastolic function and no significant valvular abnormalities.  Her chart lists a diagnosis of hypertension, but her blood pressure is normal without treatment.  She does not have diabetes mellitus and has never smoked.  She has mildly elevated LDL cholesterol in the low 130s.  Past Medical History:  Diagnosis Date   Chest pain    Hypertension    Palpitations    SVT (supraventricular tachycardia) (HCC)     Past Surgical History:  Procedure Laterality Date   APPENDECTOMY     DILATATION & CURRETTAGE/HYSTEROSCOPY WITH RESECTOCOPE     x2   Nuclear Stress Test  06/10/2012   Normal   TONSILLECTOMY     TUBAL LIGATION      Current Medications: Current Meds  Medication Sig   CALCIUM-VITAMIN D PO Take 1 tablet by mouth daily.   Lactobacillus Rhamnosus, GG, (CULTURELLE PO) Take 1 tablet by mouth daily.   valACYclovir (VALTREX) 1000 MG tablet Take 2000 mg twice daily for 1 day   [DISCONTINUED] Ascorbic Acid (VITAMIN  C) 1000 MG tablet Take 1,000 mg by mouth daily.   [DISCONTINUED] ciprofloxacin (CIPRO) 500 MG tablet Take 1 tablet (500 mg total) by mouth 2 (two) times daily.   [DISCONTINUED] Vitamin D, Ergocalciferol, (DRISDOL) 1.25 MG (50000 UT) CAPS capsule Take 1 capsule (50,000 Units total) by mouth every 7 (seven) days.   [DISCONTINUED] Zinc 50 MG TABS Take 1 tablet by mouth daily.     Allergies:   Erythromycin   Social History   Socioeconomic History   Marital status: Divorced    Spouse name: Not on file   Number of children: Not on file   Years of education: Not on file   Highest education level: Not  on file  Occupational History   Not on file  Tobacco Use   Smoking status: Never   Smokeless tobacco: Never  Substance and Sexual Activity   Alcohol use: No   Drug use: No   Sexual activity: Not on file  Other Topics Concern   Not on file  Social History Narrative   Not on file   Social Determinants of Health   Financial Resource Strain: Not on file  Food Insecurity: Not on file  Transportation Needs: Not on file  Physical Activity: Not on file  Stress: Not on file  Social Connections: Not on file     Family History: The patient's breast cancer and diabetes mellitus in her sister, family history includes Diabetes in her sister; Heart attack in her father; Heart failure in her father. Patient's mother had a pacemaker, died at age 3. ROS:   Please see the history of present illness.     All other systems reviewed and are negative.  EKGs/Labs/Other Studies Reviewed:    The following studies were reviewed today: Echocardiogram 07/21/2012  - Left ventricle: The cavity size was normal. Wall thickness    was normal. Systolic function was normal. The estimated    ejection fraction was in the range of 60% to 65%. Left    ventricular diastolic function parameters were normal.  - Left atrium: LA Volume/BSA 25 ml/m2. The atrium was normal    in size.  - Atrial septum: No defect or patent foramen ovale was    identified.  Impressions:   - Essentially normal study.    Nuclear perfusion stress study 06/10/2012 Exercise Capacity:  Good exercise capacity. BP Response:  Normal blood pressure response. Clinical Symptoms:  Left sided Neck pain 2-3/4 ECG Impression:  Insignificant upsloping ST segment depression. LV Wall Motion:  NL LV Function; NL Wall Motion - LVEF likely overestimated due to low LV volumes. Comparison with Prior Nuclear Study: No significant change from previous study Overall Impression:  Normal stress nuclear study.  Low risk stress nuclear study.  EKG:  EKG  is ordered today.  The ekg ordered today demonstrates NSR, normal tracing  Recent Labs: No results found for requested labs within last 8760 hours.  Hemoglobin 12.5 Recent Lipid Panel    Component Value Date/Time   CHOL 193 02/07/2018 0835   TRIG 77 02/07/2018 0835   HDL 63 02/07/2018 0835   CHOLHDL 3.1 02/07/2018 0835   CHOLHDL 3.3 09/09/2015 0816   VLDL 15 09/09/2015 0816   LDLCALC 115 (H) 02/07/2018 0835     Risk Assessment/Calculations:           Physical Exam:    VS:  BP 132/78    Pulse 76    Ht 5\' 5"  (1.651 m)    Wt 148 lb 12.8 oz (  67.5 kg)    SpO2 98%    BMI 24.76 kg/m   Wt Readings from Last 3 Encounters:  03/26/21 148 lb 12.8 oz (67.5 kg)  12/20/18 136 lb (61.7 kg)  02/07/18 136 lb (61.7 kg)     GEN: appears lean and fit, younger than stated age, Well nourished, well developed in no acute distress HEENT: Normal NECK: No JVD; No carotid bruits LYMPHATICS: No lymphadenopathy CARDIAC: RRR, no murmurs, rubs, gallops RESPIRATORY:  Clear to auscultation without rales, wheezing or rhonchi  ABDOMEN: Soft, non-tender, non-distended MUSCULOSKELETAL:  No edema; No deformity  SKIN: Warm and dry NEUROLOGIC:  Alert and oriented x 3 PSYCHIATRIC:  Normal affect   ASSESSMENT:    1. Near syncope   2. Palpitations    PLAN:    In order of problems listed above:  Near syncope: The symptoms occurred more than 3 months ago and have not recurred.  Hard to say whether they had an arrhythmia as the substrate.  I think we should take advantage of her smart watch and use this to obtain tracings during symptomatic events.  It will be easy to miss significant arrhythmia on an event monitor that she would wear for only 2 or 4 weeks.  She had no evidence of structural heart disease on echocardiogram and nuclear stress testing, albeit performed about 8 years ago. Palpitations: In the past these responded well to empirical treatment with beta-blockers, but she stopped these medications  since she did not want to take prescription medicines all the time.  We discussed the fact that we could represcribe a beta-blocker to be used "as needed" but at this point no specific treatment appears to be necessary.  Asked her to send me symptom driven rhythm tracings from her smart watch via [place].       Medication Adjustments/Labs and Tests Ordered: Current medicines are reviewed at length with the patient today.  Concerns regarding medicines are outlined above.  Orders Placed This Encounter  Procedures   EKG 12-Lead   No orders of the defined types were placed in this encounter.   Patient Instructions  Medication Instructions:  No changes *If you need a refill on your cardiac medications before your next appointment, please call your pharmacy*   Lab Work: None ordered If you have labs (blood work) drawn today and your tests are completely normal, you will receive your results only by: [place] (if you have MyChart) OR A paper copy in the mail If you have any lab test that is abnormal or we need to change your treatment, we will call you to review the results.   Testing/Procedures: None ordered   Follow-Up: At [facility], you and your health needs are our priority.  As part of our continuing mission to provide you with exceptional heart care, we have created designated Provider Care Teams.  These Care Teams include your primary Cardiologist (physician) and Advanced Practice Providers (APPs -  Physician Assistants and Nurse Practitioners) who all work together to provide you with the care you need, when you need it.  We recommend signing up for the patient portal called "MyChart".  Sign up information is provided on this After Visit Summary.  MyChart is used to connect with patients for Virtual Visits (Telemedicine).  Patients are able to view lab/test results, encounter notes, upcoming appointments, etc.  Non-urgent messages can be sent to your provider as  well.   To learn more about what you can do with MyChart, go to [email or web address].  Your next appointment:   12 month(s)  The format for your next appointment:   In Person  Provider:   Sanda Klein, MD    Signed, Sanda Klein, MD  03/26/2021 10:47 AM    [place]

## 2021-04-11 DIAGNOSIS — J069 Acute upper respiratory infection, unspecified: Secondary | ICD-10-CM | POA: Diagnosis not present

## 2021-04-11 DIAGNOSIS — Z20822 Contact with and (suspected) exposure to covid-19: Secondary | ICD-10-CM | POA: Diagnosis not present

## 2021-04-14 DIAGNOSIS — Z01419 Encounter for gynecological examination (general) (routine) without abnormal findings: Secondary | ICD-10-CM | POA: Diagnosis not present

## 2021-04-14 DIAGNOSIS — M8580 Other specified disorders of bone density and structure, unspecified site: Secondary | ICD-10-CM | POA: Diagnosis not present

## 2021-04-19 ENCOUNTER — Encounter: Payer: Self-pay | Admitting: Cardiovascular Disease

## 2021-07-03 ENCOUNTER — Other Ambulatory Visit: Payer: Self-pay | Admitting: Registered Nurse

## 2021-07-03 DIAGNOSIS — E7800 Pure hypercholesterolemia, unspecified: Secondary | ICD-10-CM

## 2021-07-28 ENCOUNTER — Other Ambulatory Visit: Payer: BC Managed Care – PPO

## 2021-09-08 ENCOUNTER — Ambulatory Visit
Admission: RE | Admit: 2021-09-08 | Discharge: 2021-09-08 | Disposition: A | Discharge status: Home / Self Care | Payer: No Typology Code available for payment source | Source: Ambulatory Visit | Attending: Registered Nurse | Admitting: Registered Nurse

## 2021-09-08 DIAGNOSIS — E7800 Pure hypercholesterolemia, unspecified: Secondary | ICD-10-CM

## 2022-05-19 ENCOUNTER — Other Ambulatory Visit: Payer: Self-pay

## 2022-05-19 ENCOUNTER — Emergency Department (HOSPITAL_COMMUNITY): Payer: Medicare Other

## 2022-05-19 ENCOUNTER — Emergency Department (HOSPITAL_COMMUNITY)
Admission: EM | Admit: 2022-05-19 | Discharge: 2022-05-20 | Disposition: A | Discharge status: Home / Self Care | Payer: Medicare Other | Attending: Emergency Medicine | Admitting: Emergency Medicine

## 2022-05-19 ENCOUNTER — Encounter (HOSPITAL_COMMUNITY): Payer: Self-pay

## 2022-05-19 ENCOUNTER — Ambulatory Visit: Payer: Self-pay

## 2022-05-19 DIAGNOSIS — R5383 Other fatigue: Secondary | ICD-10-CM | POA: Insufficient documentation

## 2022-05-19 DIAGNOSIS — Z91040 Latex allergy status: Secondary | ICD-10-CM | POA: Diagnosis not present

## 2022-05-19 DIAGNOSIS — R079 Chest pain, unspecified: Secondary | ICD-10-CM | POA: Diagnosis present

## 2022-05-19 DIAGNOSIS — R0789 Other chest pain: Secondary | ICD-10-CM | POA: Diagnosis not present

## 2022-05-19 DIAGNOSIS — Z20822 Contact with and (suspected) exposure to covid-19: Secondary | ICD-10-CM | POA: Insufficient documentation

## 2022-05-19 LAB — COMPREHENSIVE METABOLIC PANEL
ALT: 23 U/L (ref 0–44)
AST: 20 U/L (ref 15–41)
Albumin: 3.7 g/dL (ref 3.5–5.0)
Alkaline Phosphatase: 78 U/L (ref 38–126)
Anion gap: 11 (ref 5–15)
BUN: 17 mg/dL (ref 8–23)
CO2: 27 mmol/L (ref 22–32)
Calcium: 9.2 mg/dL (ref 8.9–10.3)
Chloride: 102 mmol/L (ref 98–111)
Creatinine, Ser: 0.92 mg/dL (ref 0.44–1.00)
GFR, Estimated: >60 mL/min (ref >60)
Glucose, Bld: 102 mg/dL — ABNORMAL HIGH (ref 70–99)
Potassium: 4 mmol/L (ref 3.5–5.1)
Sodium: 140 mmol/L (ref 135–145)
Total Bilirubin: <0.1 mg/dL — ABNORMAL LOW (ref 0.3–1.2)
Total Protein: 7.1 g/dL (ref 6.5–8.1)

## 2022-05-19 LAB — URINALYSIS, ROUTINE W REFLEX MICROSCOPIC
Bacteria, UA: NONE SEEN
Bilirubin Urine: NEGATIVE
Glucose, UA: NEGATIVE mg/dL
Ketones, ur: NEGATIVE mg/dL
Nitrite: NEGATIVE
Protein, ur: NEGATIVE mg/dL
Specific Gravity, Urine: 1.026 (ref 1.005–1.030)
WBC, UA: >50 WBC/hpf (ref 0–5)
pH: 5 (ref 5.0–8.0)

## 2022-05-19 LAB — CBC WITH DIFFERENTIAL/PLATELET
Abs Immature Granulocytes: 0.01 10*3/uL (ref 0.00–0.07)
Basophils Absolute: 0 10*3/uL (ref 0.0–0.1)
Basophils Relative: 1 %
Eosinophils Absolute: 0.1 10*3/uL (ref 0.0–0.5)
Eosinophils Relative: 2 %
HCT: 35.9 % — ABNORMAL LOW (ref 36.0–46.0)
Hemoglobin: 11.7 g/dL — ABNORMAL LOW (ref 12.0–15.0)
Immature Granulocytes: 0 %
Lymphocytes Relative: 36 %
Lymphs Abs: 2 10*3/uL (ref 0.7–4.0)
MCH: 29.7 pg (ref 26.0–34.0)
MCHC: 32.6 g/dL (ref 30.0–36.0)
MCV: 91.1 fL (ref 80.0–100.0)
Monocytes Absolute: 0.5 10*3/uL (ref 0.1–1.0)
Monocytes Relative: 10 %
Neutro Abs: 2.9 10*3/uL (ref 1.7–7.7)
Neutrophils Relative %: 51 %
Platelets: 268 10*3/uL (ref 150–400)
RBC: 3.94 MIL/uL (ref 3.87–5.11)
RDW: 13.3 % (ref 11.5–15.5)
WBC: 5.6 10*3/uL (ref 4.0–10.5)
nRBC: 0 % (ref 0.0–0.2)

## 2022-05-19 LAB — TROPONIN I (HIGH SENSITIVITY)
Troponin I (High Sensitivity): 2 ng/L (ref <18)
Troponin I (High Sensitivity): 4 ng/L (ref <18)

## 2022-05-19 NOTE — ED Notes (Signed)
Pt gone to Xray 

## 2022-05-19 NOTE — ED Provider Notes (Signed)
[facility] Provider Note   CSN: [id number] Arrival date & time: 05/19/22  1826     History {Add pertinent medical, surgical, social history, OB history to HPI:1} Chief Complaint  Patient presents with   Fatigue   Arm Pain   Chest Pain    Alisha Valencia is a 67 y.o. female.   Asymptomatic while in th ED.    Fatieue since Friday.    Left arm pain since *** better in the day and worse at night.   No chest pain, ***.   Felt like a sharp pain that radiated  from the left side of her chest and stayed there. No chest pain currently.    Chest pain lasted less than 1 minute tonight and resolved.    2 81 mg asa PTA   Denies PMHx of MI, DM, HTN, CAD, ***, stents.     Elevated cholesterol. No meds at this time.    Pt denies recent travel, immobilization, surgery, estrogen use, birth control use, or PMHx of PE/DVT.   The history is provided by the patient and a relative. No language interpreter was used.       Home Medications Prior to Admission medications   Medication Sig Start Date End Date Taking? Authorizing Provider  CALCIUM-VITAMIN D PO Take 1 tablet by mouth daily.    [provider]  Lactobacillus Rhamnosus, GG, (CULTURELLE PO) Take 1 tablet by mouth daily.    [provider]  valACYclovir (VALTREX) 1000 MG tablet Take 2000 mg twice daily for 1 day 05/04/19   Montine Circle, PA-C      Allergies    Erythromycin and Latex    Review of Systems   Review of Systems  Constitutional:  Positive for diaphoresis (2 episodes today and resolved at this time).  Respiratory:  Negative for shortness of breath.   Cardiovascular:  Positive for chest pain. Negative for palpitations.  Gastrointestinal:  Negative for abdominal pain, constipation, diarrhea, nausea and vomiting.  Genitourinary:  Negative for dysuria, frequency, hematuria and urgency.  All other systems reviewed and are negative.   Physical  Exam Updated Vital Signs BP (!) 142/69   Pulse 83   Temp 98.5 F (36.9 C) (Oral)   Resp 18   SpO2 100%  Physical Exam Vitals and nursing note reviewed.  Constitutional:      General: She is not in acute distress.    Appearance: She is not diaphoretic.  HENT:     Head: Normocephalic and atraumatic.     Mouth/Throat:     Pharynx: No oropharyngeal exudate.  Eyes:     General: No scleral icterus.    Conjunctiva/sclera: Conjunctivae normal.  Cardiovascular:     Rate and Rhythm: Normal rate and regular rhythm.     Pulses: Normal pulses.     Heart sounds: Normal heart sounds.  Pulmonary:     Effort: Pulmonary effort is normal. No respiratory distress.     Breath sounds: Normal breath sounds. No wheezing.  Chest:     Chest wall: No tenderness.  Abdominal:     General: Bowel sounds are normal.     Palpations: Abdomen is soft. There is no mass.     Tenderness: There is no abdominal tenderness. There is no guarding or rebound.  Musculoskeletal:        General: Normal range of motion.     Cervical back: Normal range of motion and neck supple.  Skin:  General: Skin is warm and dry.  Neurological:     Mental Status: She is alert.  Psychiatric:        Behavior: Behavior normal.     ED Results / Procedures / Treatments   Labs (all labs ordered are listed, but only abnormal results are displayed) Labs Reviewed  COMPREHENSIVE METABOLIC PANEL - Abnormal; Notable for the following components:      Result Value   Glucose, Bld 102 (*)    Total Bilirubin <0.1 (*)    All other components within normal limits  CBC WITH DIFFERENTIAL/PLATELET - Abnormal; Notable for the following components:   Hemoglobin 11.7 (*)    HCT 35.9 (*)    All other components within normal limits  URINALYSIS, ROUTINE W REFLEX MICROSCOPIC - Abnormal; Notable for the following components:   APPearance HAZY (*)    Hgb urine dipstick LARGE (*)    Leukocytes,Ua LARGE (*)    All other components within normal  limits  TROPONIN I (HIGH SENSITIVITY)  TROPONIN I (HIGH SENSITIVITY)    EKG EKG Interpretation  Date/Time:  Tuesday May 19 2022 18:34:18 EDT Ventricular Rate:  81 PR Interval:  140 QRS Duration: 88 QT Interval:  332 QTC Calculation: 385 R Axis:   69 Text Interpretation: Normal sinus rhythm Normal ECG When compared with ECG of 15-Feb-2007 18:03, PREVIOUS ECG IS PRESENT since last tracing no significant change Confirmed by Malvin Johns [phone]) on 05/19/2022 10:53:55 PM  Radiology No results found.  Procedures Procedures    Medications Ordered in ED Medications - No data to display  ED Course/ Medical Decision Making/ A&P                             Medical Decision Making Amount and/or Complexity of Data Reviewed Radiology: ordered.   Patient presents to the ED with *** chest pain x ***. *** No prior history of MI, catheterization, DVT/PE. Vital signs ***, patient not {hypoxic or tachycardic}. On exam patient with ***. Otherwise, no acute cardiovascular, respiratory, abdominal findings. Differential diagnosis includes ACS, aortic dissection, pneumothorax, PE, PNA.    Additional history obtained:  Additional history obtained from {sabhistory:27144} External records from outside source obtained and reviewed including: ***  EKG:  ***.  Labs:  I ordered, and personally interpreted labs.  The pertinent results include:   Troponin *** CBC *** BMP ***  Imaging: I ordered imaging studies including CXR I independently visualized and interpreted imaging which showed: *** I agree with the radiologist interpretation  Medications:  I ordered medication including {morphine, zofran, asa, ntg/nitropaste} for *** Reevaluation of the patient after these medicines and interventions, I reevaluated the patient and found that they have {resolved/improved/worsened:23923::"improved"} I have reviewed the patients home medicines and have made adjustments as needed   {Cardiac  Monitoring: The patient was maintained on a cardiac monitor.  I personally viewed and interpreted the cardiac monitored which showed an underlying rhythm of: ***.   Test Considered: ***   Critical Interventions ***}   {Consultations: I requested consultation with the {sabspecialists:27145}, Dr. Marland Kitchen and discussed lab and imaging findings as well as pertinent plan - they recommend: ***}  Disposition: {End of MDM here with the likely diagnosis}. EKG without acute ST/T changes, troponins negative, chest x-ray negative, low suspicion for ACS at this time. Chest x-ray without acute findings, vital signs stable, doubt aortic dissection or pneumothorax at this time.  No risk factors for PE, no unilateral lower extremity  swelling, malignancy history, HRT, OCP, recent immobilizations, surgery.  *** This is likely musculoskeletal in nature.  *** Heart score, patient at {low, mod, high} risk. Case discussed with attending who agrees with discharge treatment plan at this time. After consideration of the diagnostic results and the patients response to treatment, I feel that the patient would benefit from {sabdispo:27146}. Supportive care measures and strict return precautions discussed with patient at bedside. Pt acknowledges and verbalizes understanding. Pt appears safe for discharge. Follow up as indicated in discharge paperwork.   This chart was dictated using voice recognition software, Dragon. Despite the best efforts of this provider to proofread and correct errors, errors may still occur which can change documentation meaning.   Final Clinical Impression(s) / ED Diagnoses Final diagnoses:  None    Rx / DC Orders ED Discharge Orders     None

## 2022-05-19 NOTE — ED Triage Notes (Signed)
Pt c/o fatigue and L arm pain x4 days and L side chest pain radiating into L upper back and L jaw since this afternoon.  Pain score 2/10.  Pt reports "it felt like there was an explosion in my chest earlier."

## 2022-05-19 NOTE — Telephone Encounter (Signed)
  Chief Complaint: Intermittent chest pain and left arm numbness Symptoms: above Frequency: Increasing in frequency Pertinent Negatives: Patient denies  Disposition: [x] ED /[] Urgent Care (no appt availability in office) / [] Appointment(In office/virtual)/ []  [facility]/ [] Home Care/ [] Refused Recommended Disposition /[] [facility]/ []  Follow-up with PCP Additional Notes: Spoke with pt's daughter. Did not share pt's medical information. Daughter reports that pt has chest pain, her left arm has been numb off and on the past 2 days. She will sometimes have sharp chest pain.  Daughter is on her way to pt. Who will transport pt to ED for care.    Reason for Disposition  [1] Chest pain (or "angina") comes and goes AND [2] is happening more often (increasing in frequency) or getting worse (increasing in severity)  (Exception: Chest pains that last only a few seconds.)  Answer Assessment - Initial Assessment Questions 1. LOCATION: "Where does it hurt?"       Chest pain 2. RADIATION: "Does the pain go anywhere else?" (e.g., into neck, jaw, arms, back)     Eased up  3. ONSET: "When did the chest pain begin?" (Minutes, hours or days)      Numb arm 2 days - sharp pain 4. PATTERN: "Does the pain come and go, or has it been constant since it started?"  "Does it get worse with exertion?"      Comes and goes 5. DURATION: "How long does it last" (e.g., seconds, minutes, hours)     unsure 6. SEVERITY: "How bad is the pain?"  (e.g., Scale 1-10; mild, moderate, or severe)    - MILD (1-3): doesn't interfere with normal activities     - MODERATE (4-7): interferes with normal activities or awakens from sleep    - SEVERE (8-10): excruciating pain, unable to do any normal activities       moderate 7. CARDIAC RISK FACTORS: "Do you have any history of heart problems or risk factors for heart disease?" (e.g., angina, prior heart attack; diabetes, high blood pressure, high cholesterol,  smoker, or strong family history of heart disease)     unsure 8. PULMONARY RISK FACTORS: "Do you have any history of lung disease?"  (e.g., blood clots in lung, asthma, emphysema, birth control pills)     unsure 9. CAUSE: "What do you think is causing the chest pain?"     Unsure 10. OTHER SYMPTOMS: "Do you have any other symptoms?" (e.g., dizziness, nausea, vomiting, sweating, fever, difficulty breathing, cough)       Stomach not feeling right. 11. PREGNANCY: "Is there any chance you are pregnant?" "When was your last menstrual period?"  Protocols used: Chest Pain-A-AH

## 2022-05-19 NOTE — ED Provider Triage Note (Signed)
Emergency Medicine Provider Triage Evaluation Note  Alisha Valencia , a 67 y.o. female  was evaluated in triage.  Pt complains of fatigue and chest pain.  She reports that earlier today she experienced a left-sided chest pain with radiation into her jaw and abdomen but this quickly resolved.  No prior history of any significant cardiac abnormalities but does report that she sees a cardiologist.  At the time of the chest pain denies that she was experiencing any nausea, vomiting, diaphoresis.  Is also reporting that she has been experiencing somewhat chronic fatigue for an extended period of time now without specific cause.  Denies any recent fevers, abdominal pain, diarrhea, dehydration.  Review of Systems  Positive: As above Negative: As above  Physical Exam  BP (!) 163/73 (BP Location: Right Arm)   Pulse 86   Temp 98.5 F (36.9 C) (Oral)   Resp 16   SpO2 97%  Gen:   Awake, no distress   Resp:  Normal effort MSK:   Moves extremities without difficulty  Other:    Medical Decision Making  Medically screening exam initiated at 7:04 PM.  Appropriate orders placed.  THAVY ROWLES was informed that the remainder of the evaluation will be completed by another provider, this initial triage assessment does not replace that evaluation, and the importance of remaining in the ED until their evaluation is complete.     Luvenia Heller, PA-C 05/19/22 1905

## 2022-05-20 LAB — RESP PANEL BY RT-PCR (RSV, FLU A&B, COVID)  RVPGX2
Influenza A by PCR: NEGATIVE
Influenza B by PCR: NEGATIVE
Resp Syncytial Virus by PCR: NEGATIVE
SARS Coronavirus 2 by RT PCR: NEGATIVE

## 2022-05-20 NOTE — Discharge Instructions (Signed)
It was a pleasure taking care of you today!  You have pending results for COVID, flu, RSV swab, these will show on your MyChart app. Your workup was negative in the ED.  You may use over-the-counter 500 mg Tylenol every 6 hours and alternate with 600 mg ibuprofen every 6 hours as needed for pain for no more than 7 days.  Call your cardiologist tomorrow to set up a follow-up appointment regarding today's ED visit.  Follow-up with your primary care provider regarding today's ED visit.  Return to the emergency department if you experience increasing/worsening symptoms.

## 2022-06-12 ENCOUNTER — Ambulatory Visit: Payer: Medicare Other | Admitting: Student

## 2022-10-06 ENCOUNTER — Encounter: Payer: Self-pay | Admitting: Cardiovascular Disease

## 2022-10-06 ENCOUNTER — Ambulatory Visit: Payer: Medicare Other | Admitting: Cardiovascular Disease

## 2022-10-06 VITALS — BP 116/78 | HR 71 | Ht 65.0 in | Wt 141.0 lb

## 2022-10-06 DIAGNOSIS — R002 Palpitations: Secondary | ICD-10-CM | POA: Insufficient documentation

## 2022-10-06 DIAGNOSIS — E7800 Pure hypercholesterolemia, unspecified: Secondary | ICD-10-CM | POA: Diagnosis present

## 2022-10-06 DIAGNOSIS — R55 Syncope and collapse: Secondary | ICD-10-CM | POA: Insufficient documentation
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
## 2022-10-06 NOTE — Patient Instructions (Signed)
Medication Instructions:  No changes *If you need a refill on your cardiac medications before your next appointment, please call your pharmacy*  Follow-Up: At [facility], you and your health needs are our priority.  As part of our continuing mission to provide you with exceptional heart care, we have created designated Provider Care Teams.  These Care Teams include your primary Cardiologist (physician) and Advanced Practice Providers (APPs -  Physician Assistants and Nurse Practitioners) who all work together to provide you with the care you need, when you need it.  We recommend signing up for the patient portal called "MyChart".  Sign up information is provided on this After Visit Summary.  MyChart is used to connect with patients for Virtual Visits (Telemedicine).  Patients are able to view lab/test results, encounter notes, upcoming appointments, etc.  Non-urgent messages can be sent to your provider as well.   To learn more about what you can do with MyChart, go to [email or web address].    Your next appointment:    Follow up as needed  Provider:   Mihai Croitoru, MD        

## 2023-01-12 ENCOUNTER — Ambulatory Visit: Payer: Medicare Other | Admitting: Dermatology

## 2023-01-12 ENCOUNTER — Encounter: Payer: Self-pay | Admitting: Dermatology

## 2023-01-12 VITALS — BP 135/81 | HR 72

## 2023-01-12 DIAGNOSIS — W908XXA Exposure to other nonionizing radiation, initial encounter: Secondary | ICD-10-CM

## 2023-01-12 DIAGNOSIS — D1801 Hemangioma of skin and subcutaneous tissue: Secondary | ICD-10-CM

## 2023-01-12 DIAGNOSIS — L578 Other skin changes due to chronic exposure to nonionizing radiation: Secondary | ICD-10-CM

## 2023-01-12 DIAGNOSIS — D229 Melanocytic nevi, unspecified: Secondary | ICD-10-CM

## 2023-01-12 DIAGNOSIS — L821 Other seborrheic keratosis: Secondary | ICD-10-CM

## 2023-01-12 DIAGNOSIS — L814 Other melanin hyperpigmentation: Secondary | ICD-10-CM

## 2023-01-12 DIAGNOSIS — Z1283 Encounter for screening for malignant neoplasm of skin: Secondary | ICD-10-CM | POA: Diagnosis not present

## 2023-01-12 DIAGNOSIS — L570 Actinic keratosis: Secondary | ICD-10-CM | POA: Diagnosis not present

## 2023-01-12 NOTE — Patient Instructions (Addendum)
Cryotherapy Aftercare  Wash gently with soap and water everyday.   Apply Vaseline and Band-Aid daily until healed.   Skin Education : We counseled the patient regarding the following: Sun screen (SPF 30 or greater) should be applied during peak UV exposure (between 10am and 2pm) and reapplied after exercise or swimming.  The ABCDEs of melanoma were reviewed with the patient, and the importance of monthly self-examination of moles was emphasized. Should any moles change in shape or color, or itch, bleed or burn, pt will contact our office for evaluation sooner then their interval appointment.  Plan: Sunscreen Recommendations We recommended a broad spectrum sunscreen with a SPF of 30 or higher. SPF 30 sunscreens block approximately 97 percent of the sun's harmful rays. Sunscreens should be applied at least 15 minutes prior to expected sun exposure and then every 2 hours after that as long as sun exposure continues. If swimming or exercising sunscreen should be reapplied every 45 minutes to an hour after getting wet or sweating. One ounce, or the equivalent of a shot glass full of sunscreen, is adequate to protect the skin not covered by a bathing suit. We also recommended a lip balm with a sunscreen as well. Sun protective clothing can be used in lieu of sunscreen but must be worn the entire time you are exposed to the sun's rays. Important Information   Due to recent changes in healthcare laws, you may see results of your pathology and/or laboratory studies on MyChart before the doctors have had a chance to review them. We understand that in some cases there may be results that are confusing or concerning to you. Please understand that not all results are received at the same time and often the doctors may need to interpret multiple results in order to provide you with the best plan of care or course of treatment. Therefore, we ask that you please give [place] 2 business days to thoroughly review all your  results before contacting the office for clarification. Should we see a critical lab result, you will be contacted sooner.     If You Need Anything After Your Visit   If you have any questions or concerns for your doctor, please call our main line at [phone]. If no one answers, please leave a voicemail as directed and we will return your call as soon as possible. Messages left after 4 pm will be answered the following business day.    You may also send [place] a message via MyChart. We typically respond to MyChart messages within 1-2 business days.  For prescription refills, please ask your pharmacy to contact our office. Our fax number is [phone].  If you have an urgent issue when the clinic is closed that cannot wait until the next business day, you can page your doctor at the number below.     Please note that while we do our best to be available for urgent issues outside of office hours, we are not available 24/7.    If you have an urgent issue and are unable to reach [place], you may choose to seek medical care at your doctor's office, retail clinic, urgent care center, or emergency room.   If you have a medical emergency, please immediately call 911 or go to the emergency department. In the event of inclement weather, please call our main line at [phone] for an update on the status of any delays or closures.  Dermatology Medication Tips: Please keep the boxes that topical medications come  in in order to help keep track of the instructions about where and how to use these. Pharmacies typically print the medication instructions only on the boxes and not directly on the medication tubes.   If your medication is too expensive, please contact our office at [phone] or send [place] a message through MyChart.    We are unable to tell what your co-pay for medications will be in advance as this is different depending on your insurance coverage. However, we may be able to find a substitute  medication at lower cost or fill out paperwork to get insurance to cover a needed medication.    If a prior authorization is required to get your medication covered by your insurance company, please allow [place] 1-2 business days to complete this process.   Drug prices often vary depending on where the prescription is filled and some pharmacies may offer cheaper prices.   The website www.goodrx.com contains coupons for medications through different pharmacies. The prices here do not account for what the cost may be with help from insurance (it may be cheaper with your insurance), but the website can give you the price if you did not use any insurance.  - You can print the associated coupon and take it with your prescription to the pharmacy.  - You may also stop by our office during regular business hours and pick up a GoodRx coupon card.  - If you need your prescription sent electronically to a different pharmacy, notify our office through [facility] or by phone at [phone]

## 2023-01-12 NOTE — Progress Notes (Signed)
   New Patient Visit   Subjective  Alisha Valencia is a 67 y.o. female who presents for the following: Skin Cancer Screening and Full Body Skin Exam  The patient presents for Total-Body Skin Exam (TBSE) for skin cancer screening and mole check. The patient has spots, moles and lesions to be evaluated, some may be new or changing. Pt has possible hx of BCC and SCC --last around 2 years ago. Possible family hx of MM   The following portions of the chart were reviewed this encounter and updated as appropriate: medications, allergies, medical history  Review of Systems:  No other skin or systemic complaints except as noted in HPI or Assessment and Plan.  Objective  Well appearing patient in no apparent distress; mood and affect are within normal limits.  A full examination was performed including scalp, head, eyes, ears, nose, lips, neck, chest, axillae, abdomen, back, buttocks, bilateral upper extremities, bilateral lower extremities, hands, feet, fingers, toes, fingernails, and toenails. All findings within normal limits unless otherwise noted below.   Relevant physical exam findings are noted in the Assessment and Plan.   Assessment & Plan   SKIN CANCER SCREENING PERFORMED TODAY.  ACTINIC DAMAGE - Chronic condition, secondary to cumulative UV/sun exposure - diffuse scaly erythematous macules with underlying dyspigmentation - Recommend daily broad spectrum sunscreen SPF 30+ to sun-exposed areas, reapply every 2 hours as needed.  - Staying in the shade or wearing long sleeves, sun glasses (UVA+UVB protection) and wide brim hats (4-inch brim around the entire circumference of the hat) are also recommended for sun protection.  - Call for new or changing lesions.  MELANOCYTIC NEVI - Tan-brown and/or pink-flesh-colored symmetric macules and papules - Benign appearing on exam today - Observation - Call clinic for new or changing moles - Recommend daily use of broad spectrum spf 30+  sunscreen to sun-exposed areas.    SEBORRHEIC KERATOSIS - Stuck-on, waxy, tan-brown papules and/or plaques  - Benign-appearing - Discussed benign etiology and prognosis. - Observe - Call for any changes  LENTIGINES Exam: scattered tan macules Due to sun exposure Treatment Plan: Benign-appearing, observe. Recommend daily broad spectrum sunscreen SPF 30+ to sun-exposed areas, reapply every 2 hours as needed.  Call for any changes    HEMANGIOMA Exam: red papule(s) Discussed benign nature. Recommend observation. Call for changes.   AK (actinic keratosis) Left Malar Cheek  Destruction of lesion - Left Malar Cheek  Destruction method: cryotherapy   Lesion destroyed using liquid nitrogen: Yes   Region frozen until ice ball extended beyond lesion: Yes     Return in about 1 year (around 01/12/2024) for TBSE.  I, Tillie Fantasia, CMA, am acting as scribe for Gwenith Daily, MD.   Documentation: I have reviewed the above documentation for accuracy and completeness, and I agree with the above.  Gwenith Daily, MD

## 2023-07-05 LAB — LAB REPORT - SCANNED: EGFR: 83

## 2023-07-12 ENCOUNTER — Encounter: Payer: Self-pay | Admitting: Registered Nurse
